# Patient Record
Sex: Male | Born: 1945 | Race: White | Hispanic: No | Smoking: Never smoker
Health system: Southern US, Community
[De-identification: ages and names within clinical notes are randomized; demographics above are authoritative.]

## PROBLEM LIST (undated history)

## (undated) ENCOUNTER — Emergency Department (HOSPITAL_COMMUNITY): Admission: EM | Payer: Self-pay | Source: Home / Self Care

## (undated) DIAGNOSIS — E785 Hyperlipidemia, unspecified: Secondary | ICD-10-CM

## (undated) DIAGNOSIS — I25110 Atherosclerotic heart disease of native coronary artery with unstable angina pectoris: Secondary | ICD-10-CM

## (undated) DIAGNOSIS — I2119 ST elevation (STEMI) myocardial infarction involving other coronary artery of inferior wall: Secondary | ICD-10-CM

## (undated) NOTE — *Deleted (*Deleted)
NAME:  Patrick Weber, MRN:  [id number], DOB:  Nov 16, 1945, LOS: 0 ADMISSION DATE:  Mar 15, 2020, CONSULTATION DATE:  10/19 REFERRING MD:  [place], CHIEF COMPLAINT:  Cardiac arrest/vent management    Brief History   63 year old male w/ known CAD and ICM (last cath w/ PCI to LAD 3/21)  admitted 10/19 s/p STEMI after holding Brilinta for planned hernia surg. Had witness VT/VF arrest on card cath table. Intubated by anesthesia. Underwent left heart cath. PCCM asked to assist w/ care   History of present illness   59 year old male w/ hx as below had Brilinta on hold x 5d for planned elective hernia repair. Presented to ER 10/19 w/ cc: sudden onset chest pain and associated dyspnea. Rushed to cardiac cath lab for STEMI. While on cardiac cath table had witnessed VT and VF arrest X ** total down time estimated at ** w/ provider CPR. Left heart cath showed: **, underwent** and subsequent impella device. PCCM asked to assist w/ post-arrest stabilization, critical care and ventilator management.    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   Significant Hospital Events   ***  Consults:  ***  Procedures:  ***  Significant Diagnostic Tests:  ***  Micro Data:  ***  Antimicrobials:  ***   Interim history/subjective:  ***  Objective   SpO2 100 %.       No intake or output data in the 24 hours ending 03/15/2020 1143 There were no vitals filed for this visit.  Examination: General: *** HENT: *** Lungs: *** Cardiovascular: *** Abdomen: *** Extremities: *** Neuro: *** GU: ***  Resolved Hospital Problem list   ***  Assessment & Plan:  ***  Best practice:  Diet: *** Pain/Anxiety/Delirium protocol (if indicated): *** VAP protocol (if indicated): *** DVT prophylaxis: *** GI prophylaxis: *** Glucose control: *** Mobility: *** Code Status: *** Family Communication: ***  Disposition:   Labs   CBC: No results for input(s): WBC, NEUTROABS, HGB, HCT, MCV, PLT in the last 168 hours.  Basic Metabolic Panel: Recent Labs  Lab 02/20/20 1346  NA 137  K 3.7  CL 105  CO2 25  GLUCOSE 99  BUN 13  CREATININE 0.94  CALCIUM 8.2*   GFR: Estimated Creatinine Clearance: 78 mL/min (by C-G formula based on SCr of 0.94 mg/dL). No results for input(s): PROCALCITON, WBC, LATICACIDVEN in the last 168 hours.  Liver Function Tests: No results for input(s): AST, ALT, ALKPHOS, BILITOT, PROT, ALBUMIN in the last 168 hours. No results for input(s): LIPASE, AMYLASE in the last 168 hours. No results for input(s): AMMONIA in the last 168 hours.  ABG    Component Value Date/Time   TCO2 22 09/18/2016 1632     Coagulation Profile: No results for input(s): INR, PROTIME in the last 168 hours.  Cardiac Enzymes: No results for input(s): CKTOTAL, CKMB, CKMBINDEX, TROPONINI in the last 168 hours.  HbA1C: Hgb A1c MFr Bld  Date/Time Value Ref Range Status  09/18/2016 07:42 PM 4.8 4.8 - 5.6 % Final    Comment:    (NOTE)         Pre-diabetes: 5.7 - 6.4         Diabetes: >6.4         Glycemic control for adults with diabetes: <7.0     CBG: No results for input(s): GLUCAP in the last 168 hours.  Review of Systems:   ***  Past Medical History  He,  has a past medical history of Acute ST elevation myocardial infarction (STEMI) of inferior wall (HCC) (09/18/2016), Coronary artery disease involving native coronary artery of native heart with unstable angina pectoris (HCC) (09/18/2016), and Hyperlipemia.   Surgical History    Past Surgical History:  Procedure Laterality Date  . CORONARY BALLOON ANGIOPLASTY N/A 07/22/2019   Procedure: CORONARY BALLOON ANGIOPLASTY;  Surgeon: [name], MD;  Location: [facility];  Service: Cardiovascular;  Laterality: N/A;  . CORONARY STENT INTERVENTION N/A 07/22/2019   Procedure: CORONARY STENT INTERVENTION;  Surgeon: [name], MD;  Location: [facility];  Service: Cardiovascular;  Laterality: N/A;  . LEFT HEART CATH AND CORONARY ANGIOGRAPHY N/A 09/18/2016   Procedure: Left Heart Cath and Coronary Angiography;  Surgeon: [name], MD;  Location: [facility];  Service: Cardiovascular;  Laterality: N/A;  . LEFT HEART CATH AND CORONARY ANGIOGRAPHY N/A 07/22/2019   Procedure: LEFT HEART CATH AND CORONARY ANGIOGRAPHY;  Surgeon: [name], MD;  Location: [facility];  Service: Cardiovascular;  Laterality: N/A;     Social History   reports that he has never smoked. He has never used smokeless tobacco. He reports previous alcohol use. He reports that he does not use drugs.   Family History   His family history includes Emphysema in his father; Heart disease in his mother; Other in his son.   Allergies Allergies  Allergen Reactions  . Lidocaine Other (See Comments)    Makes him sleepy.     Home Medications  Prior to Admission medications   Medication Sig Start Date End Date Taking? Authorizing Provider  acetaminophen (TYLENOL) 500 MG tablet Take 1 tablet (500 mg total) by mouth every 6 (six) hours as needed. Patient taking differently: Take 500 mg by mouth every 6 (six) hours as needed for moderate pain or headache.  01/13/20   Daryll Drown, NP  aspirin EC 81 MG tablet Take 81 mg by mouth daily. Swallow whole.    [provider]  atorvastatin (LIPITOR) 80 MG tablet Take 1 tablet (80 mg total) by mouth daily. 07/23/19 02/09/20  [place], Alvira Philips, DO  Carboxymethylcellul-Glycerin (LUBRICATING EYE DROPS OP) Place 1 drop into both eyes daily as needed (dry eyes).    [provider]  ezetimibe (ZETIA) 10 MG tablet Take 1 tablet (10 mg total) by mouth daily. 07/23/19 02/09/20  [place], Alvira Philips, DO  furosemide (LASIX) 40 MG tablet Take 1 tablet (40 mg total) by mouth daily. 12/27/19 03/26/20  [name], MD  metoprolol succinate (TOPROL-XL) 50 MG 24 hr tablet  Take 1 tablet (50 mg total) by mouth daily. 10/05/19 02/09/20  Ronney Asters, NP  nitroGLYCERIN (NITROSTAT) 0.4 MG SL tablet Place 1 tablet (0.4 mg total) under the tongue every 5 (five) minutes x 3 doses as needed for chest pain. 07/28/19   Ronney Asters, NP  sacubitril-valsartan (ENTRESTO) 97-103 MG Take 1 tablet by mouth 2 (two) times daily. 10/05/19   Ronney Asters, NP  spironolactone (ALDACTONE) 25 MG tablet Take 12.5 mg by mouth daily.    [provider]  ticagrelor (BRILINTA) 90 MG TABS tablet Take 1 tablet (90 mg total) by mouth 2 (two) times daily. 08/29/19   Ronney Asters, NP     Critical care time: ***

---

## 2016-09-18 ENCOUNTER — Inpatient Hospital Stay (HOSPITAL_COMMUNITY): Payer: Medicare HMO

## 2016-09-18 ENCOUNTER — Encounter (HOSPITAL_COMMUNITY)
Admission: AD | Disposition: A | Discharge status: Home / Self Care | Payer: Self-pay | Source: Home / Self Care | Attending: Internal Medicine

## 2016-09-18 ENCOUNTER — Inpatient Hospital Stay (HOSPITAL_COMMUNITY)
Admission: AD | Admit: 2016-09-18 | Discharge: 2016-09-22 | DRG: 247 | Disposition: A | Discharge status: Home / Self Care | Payer: Medicare HMO | Attending: Internal Medicine | Admitting: Internal Medicine

## 2016-09-18 ENCOUNTER — Encounter (HOSPITAL_COMMUNITY): Payer: Self-pay | Admitting: Cardiology

## 2016-09-18 DIAGNOSIS — I2119 ST elevation (STEMI) myocardial infarction involving other coronary artery of inferior wall: Principal | ICD-10-CM

## 2016-09-18 DIAGNOSIS — E782 Mixed hyperlipidemia: Secondary | ICD-10-CM | POA: Clinically undetermined

## 2016-09-18 DIAGNOSIS — R072 Precordial pain: Secondary | ICD-10-CM | POA: Diagnosis not present

## 2016-09-18 DIAGNOSIS — E785 Hyperlipidemia, unspecified: Secondary | ICD-10-CM | POA: Diagnosis present

## 2016-09-18 DIAGNOSIS — R001 Bradycardia, unspecified: Secondary | ICD-10-CM | POA: Diagnosis not present

## 2016-09-18 DIAGNOSIS — L8990 Pressure ulcer of unspecified site, unspecified stage: Secondary | ICD-10-CM | POA: Insufficient documentation

## 2016-09-18 DIAGNOSIS — I25110 Atherosclerotic heart disease of native coronary artery with unstable angina pectoris: Secondary | ICD-10-CM

## 2016-09-18 DIAGNOSIS — R739 Hyperglycemia, unspecified: Secondary | ICD-10-CM | POA: Diagnosis present

## 2016-09-18 DIAGNOSIS — Z955 Presence of coronary angioplasty implant and graft: Secondary | ICD-10-CM

## 2016-09-18 DIAGNOSIS — I959 Hypotension, unspecified: Secondary | ICD-10-CM | POA: Diagnosis not present

## 2016-09-18 DIAGNOSIS — I712 Thoracic aortic aneurysm, without rupture: Secondary | ICD-10-CM | POA: Diagnosis present

## 2016-09-18 DIAGNOSIS — I2510 Atherosclerotic heart disease of native coronary artery without angina pectoris: Secondary | ICD-10-CM | POA: Diagnosis present

## 2016-09-18 DIAGNOSIS — I472 Ventricular tachycardia: Secondary | ICD-10-CM | POA: Diagnosis not present

## 2016-09-18 DIAGNOSIS — I2111 ST elevation (STEMI) myocardial infarction involving right coronary artery: Secondary | ICD-10-CM | POA: Insufficient documentation

## 2016-09-18 DIAGNOSIS — R079 Chest pain, unspecified: Secondary | ICD-10-CM | POA: Diagnosis present

## 2016-09-18 DIAGNOSIS — I214 Non-ST elevation (NSTEMI) myocardial infarction: Secondary | ICD-10-CM | POA: Diagnosis present

## 2016-09-18 DIAGNOSIS — I213 ST elevation (STEMI) myocardial infarction of unspecified site: Secondary | ICD-10-CM

## 2016-09-18 HISTORY — DX: Atherosclerotic heart disease of native coronary artery with unstable angina pectoris: I25.110

## 2016-09-18 HISTORY — PX: LEFT HEART CATH AND CORONARY ANGIOGRAPHY: CATH118249

## 2016-09-18 HISTORY — DX: Hyperlipidemia, unspecified: E78.5

## 2016-09-18 HISTORY — DX: ST elevation (STEMI) myocardial infarction involving other coronary artery of inferior wall: I21.19

## 2016-09-18 LAB — POCT I-STAT, CHEM 8
BUN: 17 mg/dL (ref 6–20)
CALCIUM ION: 1.16 mmol/L (ref 1.15–1.40)
CHLORIDE: 107 mmol/L (ref 101–111)
Creatinine, Ser: 0.8 mg/dL (ref 0.61–1.24)
GLUCOSE: 119 mg/dL — AB (ref 65–99)
HCT: 40 % (ref 39.0–52.0)
Hemoglobin: 13.6 g/dL (ref 13.0–17.0)
Potassium: 3.5 mmol/L (ref 3.5–5.1)
SODIUM: 140 mmol/L (ref 135–145)
TCO2: 22 mmol/L (ref 0–100)

## 2016-09-18 LAB — COMPREHENSIVE METABOLIC PANEL
ALK PHOS: 55 U/L (ref 38–126)
ALT: 16 U/L — AB (ref 17–63)
AST: 35 U/L (ref 15–41)
Albumin: 3.5 g/dL (ref 3.5–5.0)
Anion gap: 9 (ref 5–15)
BUN: 13 mg/dL (ref 6–20)
CALCIUM: 8.1 mg/dL — AB (ref 8.9–10.3)
CO2: 20 mmol/L — ABNORMAL LOW (ref 22–32)
CREATININE: 0.93 mg/dL (ref 0.61–1.24)
Chloride: 109 mmol/L (ref 101–111)
Glucose, Bld: 135 mg/dL — ABNORMAL HIGH (ref 65–99)
Potassium: 3.8 mmol/L (ref 3.5–5.1)
Sodium: 138 mmol/L (ref 135–145)
TOTAL PROTEIN: 6.3 g/dL — AB (ref 6.5–8.1)
Total Bilirubin: 0.8 mg/dL (ref 0.3–1.2)

## 2016-09-18 LAB — CBC WITH DIFFERENTIAL/PLATELET
Basophils Absolute: 0 10*3/uL (ref 0.0–0.1)
Basophils Relative: 0 %
EOS ABS: 0 10*3/uL (ref 0.0–0.7)
EOS PCT: 0 %
HCT: 40.2 % (ref 39.0–52.0)
Hemoglobin: 14.2 g/dL (ref 13.0–17.0)
LYMPHS ABS: 1.1 10*3/uL (ref 0.7–4.0)
LYMPHS PCT: 12 %
MCH: 30.9 pg (ref 26.0–34.0)
MCHC: 35.3 g/dL (ref 30.0–36.0)
MCV: 87.4 fL (ref 78.0–100.0)
MONO ABS: 0.3 10*3/uL (ref 0.1–1.0)
MONOS PCT: 3 %
Neutro Abs: 8.2 10*3/uL — ABNORMAL HIGH (ref 1.7–7.7)
Neutrophils Relative %: 85 %
PLATELETS: 210 10*3/uL (ref 150–400)
RBC: 4.6 MIL/uL (ref 4.22–5.81)
RDW: 13.3 % (ref 11.5–15.5)
WBC: 9.7 10*3/uL (ref 4.0–10.5)

## 2016-09-18 LAB — PROTIME-INR
INR: 1.24
Prothrombin Time: 15.7 seconds — ABNORMAL HIGH (ref 11.4–15.2)

## 2016-09-18 LAB — TROPONIN I: Troponin I: 1.92 ng/mL (ref <0.03)

## 2016-09-18 LAB — POCT ACTIVATED CLOTTING TIME
ACTIVATED CLOTTING TIME: 202 s
ACTIVATED CLOTTING TIME: 257 s
ACTIVATED CLOTTING TIME: 274 s
Activated Clotting Time: 235 seconds
Activated Clotting Time: 246 seconds

## 2016-09-18 LAB — MRSA PCR SCREENING: MRSA by PCR: NEGATIVE

## 2016-09-18 LAB — T4, FREE: Free T4: 0.98 ng/dL (ref 0.61–1.12)

## 2016-09-18 LAB — TSH: TSH: 0.978 u[IU]/mL (ref 0.350–4.500)

## 2016-09-18 LAB — MAGNESIUM: MAGNESIUM: 2 mg/dL (ref 1.7–2.4)

## 2016-09-18 SURGERY — LEFT HEART CATH AND CORONARY ANGIOGRAPHY
Anesthesia: LOCAL

## 2016-09-18 MED ORDER — SODIUM CHLORIDE 0.9 % IV SOLN
INTRAVENOUS | Status: AC | PRN
  Administered 2016-09-18: 100 mL/h via INTRAVENOUS

## 2016-09-18 MED ORDER — VERAPAMIL HCL 2.5 MG/ML IV SOLN
INTRAVENOUS | Status: AC
  Filled 2016-09-18: qty 2

## 2016-09-18 MED ORDER — ATROPINE SULFATE 1 MG/10ML IJ SOSY
PREFILLED_SYRINGE | INTRAMUSCULAR | Status: DC | PRN
  Administered 2016-09-18: 0.5 mg via INTRAVENOUS

## 2016-09-18 MED ORDER — TICAGRELOR 90 MG PO TABS
ORAL_TABLET | ORAL | Status: AC
  Filled 2016-09-18: qty 1

## 2016-09-18 MED ORDER — HEPARIN SODIUM (PORCINE) 1000 UNIT/ML IJ SOLN
INTRAMUSCULAR | Status: DC | PRN
  Administered 2016-09-18: 8000 [IU] via INTRAVENOUS
  Administered 2016-09-18: 2000 [IU] via INTRAVENOUS
  Administered 2016-09-18: 3000 [IU] via INTRAVENOUS
  Administered 2016-09-18: 2000 [IU] via INTRAVENOUS
  Administered 2016-09-18 (×2): 3000 [IU] via INTRAVENOUS

## 2016-09-18 MED ORDER — TICAGRELOR 90 MG PO TABS
90.0000 mg | ORAL_TABLET | Freq: Two times a day (BID) | ORAL | Status: DC
  Administered 2016-09-19 – 2016-09-22 (×7): 90 mg via ORAL
  Filled 2016-09-18 (×7): qty 1

## 2016-09-18 MED ORDER — HEPARIN SODIUM (PORCINE) 1000 UNIT/ML IJ SOLN
INTRAMUSCULAR | Status: AC
  Filled 2016-09-18: qty 1

## 2016-09-18 MED ORDER — TIROFIBAN HCL IN NACL 5-0.9 MG/100ML-% IV SOLN
INTRAVENOUS | Status: AC
  Filled 2016-09-18: qty 100

## 2016-09-18 MED ORDER — ATROPINE SULFATE 1 MG/10ML IJ SOSY
PREFILLED_SYRINGE | INTRAMUSCULAR | Status: AC
  Filled 2016-09-18: qty 10

## 2016-09-18 MED ORDER — DOPAMINE-DEXTROSE 3.2-5 MG/ML-% IV SOLN
INTRAVENOUS | Status: AC
  Filled 2016-09-18: qty 250

## 2016-09-18 MED ORDER — VERAPAMIL HCL 2.5 MG/ML IV SOLN
INTRAVENOUS | Status: DC | PRN
  Administered 2016-09-18: 10 mL via INTRA_ARTERIAL

## 2016-09-18 MED ORDER — TIROFIBAN HCL IN NACL 5-0.9 MG/100ML-% IV SOLN
0.1500 ug/kg/min | INTRAVENOUS | Status: AC
  Administered 2016-09-18 – 2016-09-19 (×4): 0.15 ug/kg/min via INTRAVENOUS
  Filled 2016-09-18 (×4): qty 100

## 2016-09-18 MED ORDER — SODIUM CHLORIDE 0.9% FLUSH: 3.0000 mL | INTRAVENOUS | Status: DC | PRN

## 2016-09-18 MED ORDER — LIDOCAINE HCL (PF) 1 % IJ SOLN
INTRAMUSCULAR | Status: AC
  Filled 2016-09-18: qty 30

## 2016-09-18 MED ORDER — NOREPINEPHRINE BITARTRATE 1 MG/ML IV SOLN
INTRAVENOUS | Status: DC | PRN
  Administered 2016-09-18: 5 ug/min via INTRAVENOUS

## 2016-09-18 MED ORDER — TICAGRELOR 90 MG PO TABS
ORAL_TABLET | ORAL | Status: DC | PRN
  Administered 2016-09-18: 180 mg via ORAL

## 2016-09-18 MED ORDER — SODIUM CHLORIDE 0.9 % IV SOLN
INTRAVENOUS | Status: AC | PRN
  Administered 2016-09-18: 555 mL via INTRAVENOUS

## 2016-09-18 MED ORDER — TIROFIBAN HCL IV 12.5 MG/250 ML
INTRAVENOUS | Status: AC | PRN
  Administered 2016-09-18: .15 ug/kg/min via INTRAVENOUS

## 2016-09-18 MED ORDER — FENTANYL CITRATE (PF) 100 MCG/2ML IJ SOLN
INTRAMUSCULAR | Status: DC | PRN
  Administered 2016-09-18: 25 ug via INTRAVENOUS

## 2016-09-18 MED ORDER — IOPAMIDOL (ISOVUE-370) INJECTION 76%
INTRAVENOUS | Status: AC
  Filled 2016-09-18: qty 125

## 2016-09-18 MED ORDER — NITROGLYCERIN 0.4 MG SL SUBL: 0.4000 mg | SUBLINGUAL_TABLET | SUBLINGUAL | Status: DC | PRN

## 2016-09-18 MED ORDER — SODIUM CHLORIDE 0.9 % IV SOLN: INTRAVENOUS | Status: AC

## 2016-09-18 MED ORDER — TIROFIBAN (AGGRASTAT) BOLUS VIA INFUSION
INTRAVENOUS | Status: DC | PRN
  Administered 2016-09-18: 2155 ug via INTRAVENOUS

## 2016-09-18 MED ORDER — SODIUM CHLORIDE 0.9 % IV SOLN: INTRAVENOUS | Status: DC

## 2016-09-18 MED ORDER — ENOXAPARIN SODIUM 40 MG/0.4ML ~~LOC~~ SOLN
40.0000 mg | SUBCUTANEOUS | Status: DC
  Administered 2016-09-19 – 2016-09-21 (×3): 40 mg via SUBCUTANEOUS
  Filled 2016-09-18 (×3): qty 0.4

## 2016-09-18 MED ORDER — ONDANSETRON HCL 4 MG/2ML IJ SOLN
INTRAMUSCULAR | Status: AC
  Filled 2016-09-18: qty 2

## 2016-09-18 MED ORDER — IOPAMIDOL (ISOVUE-370) INJECTION 76%
INTRAVENOUS | Status: AC
Start: 2016-09-18 — End: 2016-09-18
  Filled 2016-09-18: qty 100

## 2016-09-18 MED ORDER — ACETAMINOPHEN 325 MG PO TABS
650.0000 mg | ORAL_TABLET | ORAL | Status: DC | PRN
  Administered 2016-09-19: 650 mg via ORAL
  Filled 2016-09-18: qty 2

## 2016-09-18 MED ORDER — ALPRAZOLAM 0.25 MG PO TABS: 0.2500 mg | ORAL_TABLET | Freq: Two times a day (BID) | ORAL | Status: DC | PRN

## 2016-09-18 MED ORDER — NITROGLYCERIN 1 MG/10 ML FOR IR/CATH LAB
INTRA_ARTERIAL | Status: AC
  Filled 2016-09-18: qty 10

## 2016-09-18 MED ORDER — ONDANSETRON HCL 4 MG/2ML IJ SOLN
4.0000 mg | Freq: Four times a day (QID) | INTRAMUSCULAR | Status: DC | PRN
  Administered 2016-09-19: 4 mg via INTRAVENOUS
  Filled 2016-09-18: qty 2

## 2016-09-18 MED ORDER — SODIUM CHLORIDE 0.9 % IV SOLN: 250.0000 mL | INTRAVENOUS | Status: DC | PRN

## 2016-09-18 MED ORDER — METOPROLOL TARTRATE 12.5 MG HALF TABLET
12.5000 mg | ORAL_TABLET | Freq: Two times a day (BID) | ORAL | Status: DC
  Administered 2016-09-19 – 2016-09-20 (×3): 12.5 mg via ORAL
  Filled 2016-09-18 (×3): qty 1

## 2016-09-18 MED ORDER — DOPAMINE-DEXTROSE 3.2-5 MG/ML-% IV SOLN
INTRAVENOUS | Status: DC | PRN
  Administered 2016-09-18: 10 ug/kg/min via INTRAVENOUS

## 2016-09-18 MED ORDER — LIDOCAINE HCL (PF) 1 % IJ SOLN
INTRAMUSCULAR | Status: DC | PRN
Start: 2016-09-18 — End: 2016-09-18
  Administered 2016-09-18: 2 mL

## 2016-09-18 MED ORDER — ASPIRIN EC 81 MG PO TBEC
81.0000 mg | DELAYED_RELEASE_TABLET | Freq: Every day | ORAL | Status: DC
  Administered 2016-09-19 – 2016-09-22 (×5): 81 mg via ORAL
  Filled 2016-09-18 (×5): qty 1

## 2016-09-18 MED ORDER — ZOLPIDEM TARTRATE 5 MG PO TABS: 5.0000 mg | ORAL_TABLET | Freq: Every evening | ORAL | Status: DC | PRN

## 2016-09-18 MED ORDER — IOPAMIDOL (ISOVUE-370) INJECTION 76%
INTRAVENOUS | Status: AC
  Filled 2016-09-18: qty 100

## 2016-09-18 MED ORDER — ATORVASTATIN CALCIUM 80 MG PO TABS
80.0000 mg | ORAL_TABLET | Freq: Every day | ORAL | Status: DC
  Administered 2016-09-19 – 2016-09-21 (×3): 80 mg via ORAL
  Filled 2016-09-18 (×3): qty 1

## 2016-09-18 MED ORDER — HYDRALAZINE HCL 20 MG/ML IJ SOLN: 5.0000 mg | INTRAMUSCULAR | Status: AC | PRN

## 2016-09-18 MED ORDER — HEPARIN (PORCINE) IN NACL 2-0.9 UNIT/ML-% IJ SOLN
INTRAMUSCULAR | Status: AC
  Filled 2016-09-18: qty 1000

## 2016-09-18 MED ORDER — ONDANSETRON HCL 4 MG/2ML IJ SOLN
INTRAMUSCULAR | Status: DC | PRN
  Administered 2016-09-18 (×2): 4 mg via INTRAVENOUS

## 2016-09-18 MED ORDER — FENTANYL CITRATE (PF) 100 MCG/2ML IJ SOLN
INTRAMUSCULAR | Status: AC
  Filled 2016-09-18: qty 2

## 2016-09-18 MED ORDER — SODIUM CHLORIDE 0.9% FLUSH
3.0000 mL | Freq: Two times a day (BID) | INTRAVENOUS | Status: DC
  Administered 2016-09-18 – 2016-09-21 (×6): 3 mL via INTRAVENOUS

## 2016-09-18 SURGICAL SUPPLY — 26 items
BALLN EMERGE MR 3.0X12 (BALLOONS) ×2
BALLN MOZEC 2.0X12 (BALLOONS) ×2
BALLN ~~LOC~~ EUPHORA RX 4.5X15 (BALLOONS) ×2
BALLOON EMERGE MR 3.0X12 (BALLOONS) IMPLANT
BALLOON MOZEC 2.0X12 (BALLOONS) IMPLANT
BALLOON ~~LOC~~ EUPHORA RX 4.5X15 (BALLOONS) IMPLANT
CATH EXTRAC PRONTO 5.5F 138CM (CATHETERS) ×1 IMPLANT
CATH INFINITI 5 FR JL3.5 (CATHETERS) ×1 IMPLANT
CATH INFINITI 5FR ANG PIGTAIL (CATHETERS) ×1 IMPLANT
CATH LAUNCHER 6FR AL.75 (CATHETERS) ×1 IMPLANT
CATH VISTA GUIDE 6FR JR4 (CATHETERS) ×1 IMPLANT
DEVICE RAD COMP TR BAND LRG (VASCULAR PRODUCTS) ×1 IMPLANT
ELECT DEFIB PAD ADLT CADENCE (PAD) ×1 IMPLANT
GLIDESHEATH SLEND SS 6F .021 (SHEATH) ×1 IMPLANT
GUIDEWIRE INQWIRE 1.5J.035X260 (WIRE) IMPLANT
INQWIRE 1.5J .035X260CM (WIRE) ×2
KIT ENCORE 26 ADVANTAGE (KITS) ×1 IMPLANT
KIT HEART LEFT (KITS) ×2 IMPLANT
PACK CARDIAC CATHETERIZATION (CUSTOM PROCEDURE TRAY) ×2 IMPLANT
STENT RESOLUTE ONYX 4.0X22 (Permanent Stent) ×1 IMPLANT
STENT RESOLUTE ONYX 4.0X26 (Permanent Stent) ×1 IMPLANT
SYR MEDRAD MARK V 150ML (SYRINGE) ×2 IMPLANT
TRANSDUCER W/STOPCOCK (MISCELLANEOUS) ×2 IMPLANT
TUBING CIL FLEX 10 FLL-RA (TUBING) ×2 IMPLANT
WIRE HI TORQ BMW 190CM (WIRE) ×2 IMPLANT
WIRE RUNTHROUGH .014X180CM (WIRE) ×1 IMPLANT

## 2016-09-18 NOTE — Progress Notes (Deleted)
Patient had decreased HR as low as 47, Arterial BP dropped into the low 70s. Turned off Nitro and notified MD. Orders received to keep nitro off, give 250cc bolus, and hold antihypertensive meds.  

## 2016-09-18 NOTE — H&P (Signed)
         Loma SenderSteven W Willmott is an 71 y.o. male.    Primary Cardiologist:new Dr. Okey DupreEnd  No primary care provider on file.  Chief Complaint: chest pain began 2 hours ago HPI:  [age] YOM with no prior cardiac hx or hx of CVA, HTN or diabetes,  Today came home from work and developed chest pain mid sternal.  He called EMS and has ST elevation in II, III, AVF.  He has had ASA and NTG X 2 pain continues but he did not wish to have Morphine.   Dr. Okey DupreEnd has seen and examined him in the ER and has brought him emergently to Cath lab.      History reviewed. No pertinent past medical history. no past Medical hx.   History reviewed. No pertinent surgical history.  No past surgical hx.    Family History  Problem Relation Age of Onset  . Heart disease Mother   . Emphysema Father    Social History:  reports that he has never smoked. He has never used smokeless tobacco. He reports that he does not use drugs. His alcohol history is not on file.  Allergies:  Allergies  Allergen Reactions  . Lidocaine Other (See Comments)    Makes him sleepy.   OUTPATIENT MEDICATIONS: No prescriptions prior to admission.  NO Medications at home   No results found for this or any previous visit (from the past 48 hour(s)). No results found.  ROS: General:no colds or fevers, no weight changes Skin:no rashes or ulcers HEENT:no blurred vision, no congestion CV:see HPI PUL:see HPI GI:no diarrhea constipation or melena, no indigestion GU:no hematuria, no dysuria MS:no joint pain, no claudication Neuro:no syncope, no lightheadedness Endo:no diabetes, no thyroid disease   Height 6\' 1"  (1.854 m), weight 190 lb (86.2 kg), SpO2 97 %. BP 94/59 P 88 respirations 18  PE: General:Pleasant affect, NAD Skin:Warm and dry, brisk capillary refill HEENT:normocephalic, sclera clear, mucus membranes moist Neck:supple, no JVD, no bruits  Heart:S1S2 RRR without murmur, gallup, rub or click Lungs:clear, ant  without rales, rhonchi,  or wheezes ZOX:WRUEAbd:soft, non tender, + BS, do not palpate liver spleen or masses Ext:no lower ext edema, 2+ pedal pulses, 2+ radial pulses Neuro:alert and oriented X 3, MAE, follows commands, + facial symmetry     Assessment/Plan Principal Problem:   Acute ST elevation myocardial infarction (STEMI) of inferior wall (HCC) - to cath lab emergently, will check serial troponins, ekgs, lipids and add statin.  Check CXR.  Other plan post procedure.    Nada BoozerLaura Ingold Nurse Practitioner Certified [facility] Pager [phone] or after 5pm or weekends call [phone] 09/18/2016, 4:55 PM

## 2016-09-18 NOTE — Progress Notes (Signed)
ANTICOAGULATION CONSULT NOTE - Initial Consult  Pharmacy Consult for Tirofiban Indication: STEMI  Allergies  Allergen Reactions  . Lidocaine Other (See Comments)    Makes him sleepy.   Patient Measurements: Height: 6\' 1"  (185.4 cm) Weight: 190 lb (86.2 kg) IBW/kg (Calculated) : 79.9  Vital Signs: BP: 124/78 (05/17 1758) Pulse Rate: 210 (05/17 1823)  Assessment: 71 yo M presents on 5/17 with CP. Found to have STEMI and taken to cath emergently. Tirofiban started in the cath and pharmacy consulted to continue for 24 hrs. Bolused in the cath lab. Hgb stable.  Goal of Therapy:  Monitor platelets by anticoagulation protocol: Yes   Plan:  Continue tirofiban at 0.5415mcg/kg/min for 24 hrs Monitor platelets, Hgb, s/s of bleeding  Morgen Ritacco J 09/18/2016,6:37 PM

## 2016-09-19 ENCOUNTER — Inpatient Hospital Stay (HOSPITAL_COMMUNITY): Payer: Medicare HMO

## 2016-09-19 ENCOUNTER — Encounter (HOSPITAL_COMMUNITY): Payer: Self-pay | Admitting: Internal Medicine

## 2016-09-19 DIAGNOSIS — I25110 Atherosclerotic heart disease of native coronary artery with unstable angina pectoris: Secondary | ICD-10-CM

## 2016-09-19 DIAGNOSIS — E785 Hyperlipidemia, unspecified: Secondary | ICD-10-CM | POA: Clinically undetermined

## 2016-09-19 DIAGNOSIS — E782 Mixed hyperlipidemia: Secondary | ICD-10-CM | POA: Clinically undetermined

## 2016-09-19 DIAGNOSIS — R739 Hyperglycemia, unspecified: Secondary | ICD-10-CM | POA: Diagnosis present

## 2016-09-19 DIAGNOSIS — R072 Precordial pain: Secondary | ICD-10-CM

## 2016-09-19 LAB — CBC
HEMATOCRIT: 39 % (ref 39.0–52.0)
HEMOGLOBIN: 13.3 g/dL (ref 13.0–17.0)
MCH: 30.4 pg (ref 26.0–34.0)
MCHC: 34.1 g/dL (ref 30.0–36.0)
MCV: 89.2 fL (ref 78.0–100.0)
Platelets: 177 10*3/uL (ref 150–400)
RBC: 4.37 MIL/uL (ref 4.22–5.81)
RDW: 13.5 % (ref 11.5–15.5)
WBC: 8.5 10*3/uL (ref 4.0–10.5)

## 2016-09-19 LAB — BASIC METABOLIC PANEL
ANION GAP: 8 (ref 5–15)
BUN: 11 mg/dL (ref 6–20)
CALCIUM: 8.2 mg/dL — AB (ref 8.9–10.3)
CO2: 20 mmol/L — ABNORMAL LOW (ref 22–32)
CREATININE: 0.88 mg/dL (ref 0.61–1.24)
Chloride: 110 mmol/L (ref 101–111)
Glucose, Bld: 116 mg/dL — ABNORMAL HIGH (ref 65–99)
Potassium: 3.6 mmol/L (ref 3.5–5.1)
Sodium: 138 mmol/L (ref 135–145)

## 2016-09-19 LAB — LIPID PANEL
Cholesterol: 159 mg/dL (ref 0–200)
HDL: 36 mg/dL — ABNORMAL LOW (ref >40)
LDL Cholesterol: 109 mg/dL — ABNORMAL HIGH (ref 0–99)
TRIGLYCERIDES: 69 mg/dL (ref <150)
Total CHOL/HDL Ratio: 4.4 RATIO
VLDL: 14 mg/dL (ref 0–40)

## 2016-09-19 LAB — ECHOCARDIOGRAM COMPLETE
Height: 73 in
Weight: 3030 oz

## 2016-09-19 LAB — TROPONIN I
Troponin I: 5.11 ng/mL (ref <0.03)
Troponin I: 9.61 ng/mL (ref <0.03)

## 2016-09-19 LAB — HEMOGLOBIN A1C
HEMOGLOBIN A1C: 4.8 % (ref 4.8–5.6)
MEAN PLASMA GLUCOSE: 91 mg/dL

## 2016-09-19 MED FILL — Heparin Sodium (Porcine) 2 Unit/ML in Sodium Chloride 0.9%: INTRAMUSCULAR | Qty: 500 | Status: AC

## 2016-09-19 MED FILL — Nitroglycerin IV Soln 100 MCG/ML in D5W: INTRA_ARTERIAL | Qty: 10 | Status: AC

## 2016-09-19 NOTE — Care Management Note (Signed)
Case Management Note Marvetta Gibbons RN, BSN Unit 2W-Case Manager-- [place] coverage [phone]   Patient Details  Name: KUSH FARABEE MRN: [id number] Date of Birth: 05-21-1945  Subjective/Objective:    Pt admitted with STEMI-  Emergent Cath with 2 drug eluting stents placed- noted pt started on Brilinta              Action/Plan: PTA pt lived at home- insurance check submitted for Brilinta coverage- per benefits check -copay cost- $242 due to pt not meeting deductible yet- spoke with pt and family at bedside- 30 day free card provided to pt to use on discharge- coverage info shared- once deductible met copay will fall under Tier 3 drug cost- per pt [facility] [place] to [facility] for any further needs  Expected Discharge Date:                  Expected Discharge Plan:  Home/Self Care  In-House Referral:     Discharge planning Services  CM Consult, Medication Assistance  Post Acute Care Choice:    Choice offered to:     DME Arranged:    DME Agency:     HH Arranged:    HH Agency:     Status of Service:  In process, will continue to follow  If discussed at Long Length of Stay Meetings, dates discussed:    Additional Comments:  Dawayne Patricia, RN 09/19/2016, 10:28 AM

## 2016-09-19 NOTE — Progress Notes (Signed)
Progress Note  Patient Name: Patrick Weber Date of Encounter: 09/19/2016  Primary Cardiologist: End  Patient Profile     71 y.o. male With no prior past history of presented to his PCP with chest pain and nausea lasting 2 hours. Noted to have inferior ST elevation and was taken directly to [facility] for [facility] cardiac catheterization. Found to have thrombotic occlusion of the distal RCA with large ectatic diffuse disease in the RCA. There was distal embolization in both RPDA and RPL. Following PCI, chest pain resolved as did nausea. Plan was to continue Aggrastat 24 hours and consider relook catheterization in 2-3 days.  Subjective   Feeling well. No further nausea or chest pain. Breathing well. Wrist site stable.  Inpatient Medications    Scheduled Meds: . aspirin EC  81 mg Oral Daily  . atorvastatin  80 mg Oral q1800  . enoxaparin (LOVENOX) injection  40 mg Subcutaneous Q24H  . metoprolol tartrate  12.5 mg Oral BID  . sodium chloride flush  3 mL Intravenous Q12H  . ticagrelor  90 mg Oral Q12H   Continuous Infusions: . sodium chloride    . sodium chloride    . tirofiban 0.15 mcg/kg/min (09/19/16 0900)   PRN Meds: sodium chloride, acetaminophen, ALPRAZolam, nitroGLYCERIN, ondansetron (ZOFRAN) IV, sodium chloride flush, zolpidem   Vital Signs    Vitals:   09/19/16 0400 09/19/16 0500 09/19/16 0600 09/19/16 0834  BP:    107/81  Pulse: (!) 59 (!) 58 66 80  Resp: 14 11 16 18   Temp: 97.8 F (36.6 C)   97.4 F (36.3 C)  TempSrc: Oral   Oral  SpO2: 98% 97% 95% 98%  Weight:  189 lb 6 oz (85.9 kg)    Height:        Intake/Output Summary (Last 24 hours) at 09/19/16 1019 Last data filed at 09/19/16 0900  Gross per 24 hour  Intake           473.76 ml  Output              850 ml  Net          -376.24 ml   Filed Weights   09/18/16 1619 09/18/16 1823 09/19/16 0500  Weight: 190 lb (86.2 kg) 187 lb 2.7 oz (84.9 kg) 189 lb 6 oz (85.9 kg)    Telemetry    NSR 60s-70s.  Case no PVCs with short burst of NSVT - Personally Reviewed  ECG     Sinus rhythm with PVCs occasional fusion complex is. Rate 69 BPM. Inferior Q waves suggestive inferior MI, age undetermined however with T-wave inversions in III with suggest recent- Personally Reviewed  Physical Exam   GEN: No acute distress.   Neck: No JVD, Or bruit Cardiac: RRR, no murmurs, rubs, or gallops.  Respiratory: Clear to auscultation bilaterally. Nonlabored GI: Soft, nontender, non-distended; NABS MS: No edema; No deformity.  Radial cath site C/D/I. No hematoma. Neuro:  Nonfocal Psych: Normal affect   Labs    Chemistry Recent Labs Lab 09/18/16 1632 09/18/16 1942 09/19/16 0627  NA 140 138 138  K 3.5 3.8 3.6  CL 107 109 110  CO2  --  20* 20*  GLUCOSE 119* 135* 116*  BUN 17 13 11   CREATININE 0.80 0.93 0.88  CALCIUM  --  8.1* 8.2*  PROT  --  6.3*  --   ALBUMIN  --  3.5  --   AST  --  35  --   ALT  --  16*  --   ALKPHOS  --  55  --   BILITOT  --  0.8  --   GFRNONAA  --  >60 >60  GFRAA  --  >60 >60  ANIONGAP  --  9 8     Hematology Recent Labs Lab 09/18/16 1632 09/18/16 1942 09/19/16 0627  WBC  --  9.7 8.5  RBC  --  4.60 4.37  HGB 13.6 14.2 13.3  HCT 40.0 40.2 39.0  MCV  --  87.4 89.2  MCH  --  30.9 30.4  MCHC  --  35.3 34.1  RDW  --  13.3 13.5  PLT  --  210 177    Cardiac Enzymes Recent Labs Lab 09/18/16 1942 09/19/16 0038 09/19/16 0627  TROPONINI 1.92* 5.11* 9.61*   No results for input(s): TROPIPOC in the last 168 hours.   BNPNo results for input(s): BNP, PROBNP in the last 168 hours.   DDimer No results for input(s): DDIMER in the last 168 hours.   Lab Results  Component Value Date   CHOL 159 09/19/2016   HDL 36 (L) 09/19/2016   LDLCALC 109 (H) 09/19/2016   TRIG 69 09/19/2016   CHOLHDL 4.4 09/19/2016   Lab Results  Component Value Date   HGBA1C 4.8 09/18/2016    Radiology    Portable Chest X-ray 1 View  Result Date: 09/18/2016 CLINICAL DATA:  ST  elevation myocardial infarction. EXAM: PORTABLE CHEST 1 VIEW COMPARISON:  None. FINDINGS: The heart size and mediastinal contours are within normal limits. Both lungs are clear. The visualized skeletal structures are unremarkable. IMPRESSION: No active disease. Electronically Signed   By: Myles RosenthalJohn  Stahl M.D.   On: 09/18/2016 20:53    Cardiac Studies    Left Heart Cath and Coronary Angiography - 09/18/16  Films personally reviewed Conclusions: 1. Acute inferior STEMI with thrombotic occlusion of the distal RCA. RCA is a large, ectatic, diffusely diseased vessel. Moderate disease involving the LAD and diagonal branches is also evident. 2. Mildly elevated left ventricular filling pressure. 3. Successful PCI with aspiration thrombectomy and drug-eluting stent placement to the mid and distal RCA. Embolization of thrombus occurred with occlusion of the distal most portions of the PDA and PL branches. Distal RCA: Resolute onyx DES 4.0 mm x 26 mm; mid RCA 4.0 mm 22 mm - postdilated to 4.5 mm  Recommendations: 1. Admit to cardiac ICU for close monitoring. 2. Dual antiplatelet therapy with aspirin and ticagrelor for at least 12 months. 3. Continue ticagrelor infusion for 24 hours. 4. Transthoracic echocardiogram to evaluate LV function. 5. Consider relook catheterization 48-72 hours to reassess thrombus burden and stent apposition, given heavy thrombus burden at the time of intervention. 6. Aggressive secondary prevention.   Diagnostic Diagram       Post-Intervention Diagram  Distal RCA: Resolute onyx DES 4.0 mm x 26 mm; mid RCA 4.0 mm 22 mm - postdilated to 4.5 mm           Echo Pending  Assessment & Plan    Principal Problem:   Acute ST elevation myocardial infarction (STEMI) of inferior wall (HCC) Active Problems:   Coronary artery disease involving native coronary artery of native heart with unstable angina pectoris (HCC)   Dyslipidemia, goal LDL below 70   Hyperglycemia  Principal  Problem:   Acute ST elevation myocardial infarction (STEMI) of inferior wall (HCC) /   Coronary artery disease involving native coronary artery of native heart with unstable angina pectoris (HCC) -- Status post  PCI with 2 DES to the RCA - extensive thrombus noted with distal lesion into both PL branch and PDA.  As per original plan. 24 hours of Aggrastat. - We'll follow his symptoms and determine whether or not it would be appropriate to consider relook cath on Monday. Patient is that if he is doing well with no active symptoms, we could probably forego a relook. However if there are any arrhythmias or symptoms over the weekend, we can consider relook on Monday.  Aspirin plus Brilinta for minimum of 1 year. Would be okay to stop aspirin at 3-6 months, but would not stop Brilinta.  Low-dose beta blocker (does not have hypertension) - mostly for stabilization of rhythm  High-dose statin    Dyslipidemia, goal LDL below 70 - high-dose statin   Hyperglycemia -- A1c is 4.8. Therefore this probably stress related. Continue to monitor.  Cardiac rehabilitation  Dispo: For now plan will be to continue in ICU setting while on Aggrastat. This is complete, he could potentially transfer to the floor if stable. Ambulate with cardiac rehabilitation. I will review films with interventional colleagues to determine other thoughts about potentially doing relook.   Signed, Bryan Lemma, MD  09/19/2016, 10:19 AM

## 2016-09-19 NOTE — Progress Notes (Signed)
Per insurance check on Brilinta # 3.  S/W JULIET @  [facility] # [phone]   1. BRILINTA 90 MG BID  COVER- YES  CO-PAY- $ 242.00   DEDUCTIBLE NOT MET  TIER- 3 DRUG  PRIOR APPROVAL- NO   TICAGRELOR- NONE FORMULARY   Alternate   1. CLOPIDOGREL 75 MG BID  COVER- YES  CO-PAY- $ 2.00  TIER- 1 DRUG  PRIOR APPROVAL- NO  MAIL-ORDER FOR 90 DAY SUPPLY ZERO DOLLARS    2. PRASUGREL 10 MG BID  COVER- YES  CO-PAY- $ 181.00  TIER- 3 DRUG  PRIOR APPROVAL- NO    PREFERRED PHARMACY : [facility] AND [facility]. ( [facility] )

## 2016-09-19 NOTE — Progress Notes (Signed)
*  PRELIMINARY RESULTS* Echocardiogram 2D Echocardiogram has been performed.  Pieter PartridgeBrooke S Khalib Fendley 09/19/2016, 10:38 AM

## 2016-09-19 NOTE — Progress Notes (Signed)
CRITICAL VALUE ALERT  Critical value received:0738  Date of notification:09/19/2016  Time of notification:0738  Critical value read back: Yes  Nurse who received alert: Hoover BrownsKristy Joselynne Killam  MD notified (1st page): [place]  Time of first WUJW:1191page:0748   MD notified (2nd page):     Time of second page:  Responding MD: Diona FantiKilroy  Time MD responded: [phone]

## 2016-09-19 NOTE — Progress Notes (Signed)
CARDIAC REHAB PHASE I   PRE:  Rate/Rhythm: 96 SR  BP:  Sitting: 104/77        SaO2: 97 RA  MODE:  Ambulation: 350 ft   POST:  Rate/Rhythm: 95 RA  BP:  Sitting: 109/74         SaO2: 98 RA  Pt ambulated 350 ft on RA, IV, handheld assist, steady gait, tolerated well with no complaints. Began MI/stent education.  Reviewed risk factors, MI book, anti-platelet therapy, stent card, activity restrictions and ntg. Left heart healthy diet handout for pt to review. Pt verbalized understanding. Pt to see case manager regarding brilinta co-pay prior to d/c. Pt to recliner after walk, feet elevated, call bell within reach. Will follow up tomorrow to complete education (needs exercise, phase 2, diet).   [phone] Joylene GrapesEmily C Kameka Whan, RN, BSN 09/19/2016 12:29 PM

## 2016-09-20 DIAGNOSIS — I712 Thoracic aortic aneurysm, without rupture: Secondary | ICD-10-CM

## 2016-09-20 MED ORDER — METOPROLOL TARTRATE 25 MG PO TABS
25.0000 mg | ORAL_TABLET | Freq: Two times a day (BID) | ORAL | Status: DC
  Administered 2016-09-20 – 2016-09-22 (×4): 25 mg via ORAL
  Filled 2016-09-20 (×4): qty 1

## 2016-09-20 NOTE — Progress Notes (Signed)
Progress Note  Patient Name: Patrick SenderSteven W Dolinger Date of Encounter: 09/20/2016  Primary Cardiologist: Dr End  Patient Profile     71 y.o. male presenting with acute inferior MI. Cardiac cath showed thrombotic occlusion of the distal RCA with diffuse disease in the RCA. Pt had aspiration thrombectomy and DES to mid and distal RCA; there was distal embolization in both RPDA and RPL; consider relook catheterization in 2-3 days.  Subjective   No chest pain or dyspnea  Inpatient Medications    Scheduled Meds: . aspirin EC  81 mg Oral Daily  . atorvastatin  80 mg Oral q1800  . enoxaparin (LOVENOX) injection  40 mg Subcutaneous Q24H  . metoprolol tartrate  12.5 mg Oral BID  . sodium chloride flush  3 mL Intravenous Q12H  . ticagrelor  90 mg Oral Q12H   Continuous Infusions: . sodium chloride     PRN Meds: sodium chloride, acetaminophen, ALPRAZolam, nitroGLYCERIN, ondansetron (ZOFRAN) IV, sodium chloride flush, zolpidem   Vital Signs    Vitals:   09/20/16 0500 09/20/16 0600 09/20/16 0700 09/20/16 0939  BP: 101/76 103/79 110/80   Pulse: 65 61 71   Resp: 11 11 15    Temp:    97.7 F (36.5 C)  TempSrc:    Oral  SpO2: 98% 98% 97%   Weight:   85.7 kg (189 lb)   Height:        Intake/Output Summary (Last 24 hours) at 09/20/16 1010 Last data filed at 09/20/16 0800  Gross per 24 hour  Intake            327.5 ml  Output             1300 ml  Net           -972.5 ml   Filed Weights   09/18/16 1823 09/19/16 0500 09/20/16 0700  Weight: 84.9 kg (187 lb 2.7 oz) 85.9 kg (189 lb 6 oz) 85.7 kg (189 lb)    Telemetry    NSR 60s-70s; short NSVT- Personally Reviewed   Physical Exam   GEN: WD/WN Neck: supple Cardiac: RRR Respiratory: CTA; no wheeze GI: NT/ND; no masses MS: No edema; radial cath site with no hematoma Neuro: grossly intact   Labs    Chemistry  Recent Labs Lab 09/18/16 1632 09/18/16 1942 09/19/16 0627  NA 140 138 138  K 3.5 3.8 3.6  CL 107 109 110  CO2   --  20* 20*  GLUCOSE 119* 135* 116*  BUN 17 13 11   CREATININE 0.80 0.93 0.88  CALCIUM  --  8.1* 8.2*  PROT  --  6.3*  --   ALBUMIN  --  3.5  --   AST  --  35  --   ALT  --  16*  --   ALKPHOS  --  55  --   BILITOT  --  0.8  --   GFRNONAA  --  >60 >60  GFRAA  --  >60 >60  ANIONGAP  --  9 8     Hematology  Recent Labs Lab 09/18/16 1632 09/18/16 1942 09/19/16 0627  WBC  --  9.7 8.5  RBC  --  4.60 4.37  HGB 13.6 14.2 13.3  HCT 40.0 40.2 39.0  MCV  --  87.4 89.2  MCH  --  30.9 30.4  MCHC  --  35.3 34.1  RDW  --  13.3 13.5  PLT  --  210 177    Cardiac Enzymes  Recent Labs Lab 09/18/16 1942 09/19/16 0038 09/19/16 0627  TROPONINI 1.92* 5.11* 9.61*    Lab Results  Component Value Date   CHOL 159 09/19/2016   HDL 36 (L) 09/19/2016   LDLCALC 109 (H) 09/19/2016   TRIG 69 09/19/2016   CHOLHDL 4.4 09/19/2016   Lab Results  Component Value Date   HGBA1C 4.8 09/18/2016    Radiology    Portable Chest X-ray 1 View  Result Date: 09/18/2016 CLINICAL DATA:  ST elevation myocardial infarction. EXAM: PORTABLE CHEST 1 VIEW COMPARISON:  None. FINDINGS: The heart size and mediastinal contours are within normal limits. Both lungs are clear. The visualized skeletal structures are unremarkable. IMPRESSION: No active disease. Electronically Signed   By: Myles Rosenthal M.D.   On: 09/18/2016 20:53    Cardiac Studies    Left Heart Cath and Coronary Angiography - 09/18/16  Films personally reviewed Conclusions: 1. Acute inferior STEMI with thrombotic occlusion of the distal RCA. RCA is a large, ectatic, diffusely diseased vessel. Moderate disease involving the LAD and diagonal branches is also evident. 2. Mildly elevated left ventricular filling pressure. 3. Successful PCI with aspiration thrombectomy and drug-eluting stent placement to the mid and distal RCA. Embolization of thrombus occurred with occlusion of the distal most portions of the PDA and PL branches. Distal RCA:  Resolute onyx DES 4.0 mm x 26 mm; mid RCA 4.0 mm 22 mm - postdilated to 4.5 mm  Recommendations: 1. Admit to cardiac ICU for close monitoring. 2. Dual antiplatelet therapy with aspirin and ticagrelor for at least 12 months. 3. Continue ticagrelor infusion for 24 hours. 4. Transthoracic echocardiogram to evaluate LV function. 5. Consider relook catheterization 48-72 hours to reassess thrombus burden and stent apposition, given heavy thrombus burden at the time of intervention. 6. Aggressive secondary prevention.   Diagnostic Diagram       Post-Intervention Diagram  Distal RCA: Resolute onyx DES 4.0 mm x 26 mm; mid RCA 4.0 mm 22 mm - postdilated to 4.5 mm           Echo Pending  Assessment & Plan    1 acute inferior myocardial infarction-patient doing well this morning with no recurrent chest pain. Continue asa, brilinta, metoprolol (increase to 25 mg BID) and statin. I will hold on adding an ACE inhibitor for now as blood pressure is borderline. His ejection fraction is 40-45%. Transfer to telemetry. Per Dr Herbie Baltimore will need follow-up opinion by interventional team concerning need for repeat catheterization Monday morning.  2 thoracic aortic aneurysm-noted on echocardiogram. Would plan MRA following DC to further assess.  3 NSVT-noted on telemetry; increase metoprolol to 25 mg twice a day.  4 hyperlipidemia-continue statin. Check lipids and liver in 4 weeks.  Signed, Olga Millers, MD  09/20/2016, 10:10 AM

## 2016-09-21 MED ORDER — IRBESARTAN 150 MG PO TABS
150.0000 mg | ORAL_TABLET | Freq: Every day | ORAL | Status: DC
  Administered 2016-09-21 – 2016-09-22 (×2): 150 mg via ORAL
  Filled 2016-09-21 (×2): qty 1

## 2016-09-21 NOTE — Progress Notes (Signed)
Progress Note  Patient Name: Patrick Weber Date of Encounter: 09/21/2016  Primary Cardiologist: End  Subjective  Denies any angina. Walked a lap in the CCU yesterday without shortness of breath. Now 2 days status post acute inferior wall myocardial infarction due to thrombotic occlusion of the distal right coronary artery, treated with placement of drug-eluting stents to the RCA, complicated by distal embolization in PDA and PLA. Incidental finding of moderate size ascending aortic aneurysm. Dr. Herbie Baltimore suggested possible need for relook cardiac catheterization in 2-3 days. Cardiac enzymes show only moderate injury with peak troponin around 9, but echo shows a sizable area of wall motion abnormality with LVEF down to 40-45 %.   Inpatient Medications    Scheduled Meds: . aspirin EC  81 mg Oral Daily  . atorvastatin  80 mg Oral q1800  . enoxaparin (LOVENOX) injection  40 mg Subcutaneous Q24H  . metoprolol tartrate  25 mg Oral BID  . sodium chloride flush  3 mL Intravenous Q12H  . ticagrelor  90 mg Oral Q12H   Continuous Infusions: . sodium chloride     PRN Meds: sodium chloride, acetaminophen, ALPRAZolam, nitroGLYCERIN, ondansetron (ZOFRAN) IV, sodium chloride flush, zolpidem   Vital Signs    Vitals:   09/20/16 1100 09/20/16 1251 09/20/16 1900 09/21/16 0500  BP: 101/76 115/77 108/80 116/80  Pulse: 86 72 76 91  Resp:  18 18 18   Temp:  97.7 F (36.5 C) 98.2 F (36.8 C) 97.8 F (36.6 C)  TempSrc:  Oral Oral Oral  SpO2:  98% 99% 97%  Weight:  189 lb 1.6 oz (85.8 kg)  189 lb 4.8 oz (85.9 kg)  Height:  6\' 1"  (1.854 m)      Intake/Output Summary (Last 24 hours) at 09/21/16 1026 Last data filed at 09/21/16 0600  Gross per 24 hour  Intake              723 ml  Output             1000 ml  Net             -277 ml   Filed Weights   09/20/16 0700 09/20/16 1251 09/21/16 0500  Weight: 189 lb (85.7 kg) 189 lb 1.6 oz (85.8 kg) 189 lb 4.8 oz (85.9 kg)    Telemetry    Sinus  rhythm - Personally Reviewed  ECG    Sinus rhythm, evolving changes of inferior MI with Q waves and minimal residual ST segment elevation, prolonged QTc 507 ms - Personally Reviewed  Physical Exam  Appears well, very calm and comfortable GEN: No acute distress.   Neck: No JVD Cardiac: RRR, no murmurs, rubs. S4 is present. Radial access site healthy with small ecchymosis  Respiratory: Clear to auscultation bilaterally. GI: Soft, nontender, non-distended  MS: No edema; No deformity. Neuro:  Nonfocal  Psych: Normal affect   Labs    Chemistry Recent Labs Lab 09/18/16 1632 09/18/16 1942 09/19/16 0627  NA 140 138 138  K 3.5 3.8 3.6  CL 107 109 110  CO2  --  20* 20*  GLUCOSE 119* 135* 116*  BUN 17 13 11   CREATININE 0.80 0.93 0.88  CALCIUM  --  8.1* 8.2*  PROT  --  6.3*  --   ALBUMIN  --  3.5  --   AST  --  35  --   ALT  --  16*  --   ALKPHOS  --  55  --   BILITOT  --  0.8  --   GFRNONAA  --  >60 >60  GFRAA  --  >60 >60  ANIONGAP  --  9 8     Hematology Recent Labs Lab 09/18/16 1632 09/18/16 1942 09/19/16 0627  WBC  --  9.7 8.5  RBC  --  4.60 4.37  HGB 13.6 14.2 13.3  HCT 40.0 40.2 39.0  MCV  --  87.4 89.2  MCH  --  30.9 30.4  MCHC  --  35.3 34.1  RDW  --  13.3 13.5  PLT  --  210 177    Cardiac Enzymes Recent Labs Lab 09/18/16 1942 09/19/16 0038 09/19/16 0627  TROPONINI 1.92* 5.11* 9.61*   No results for input(s): TROPIPOC in the last 168 hours.   BNPNo results for input(s): BNP, PROBNP in the last 168 hours.   DDimer No results for input(s): DDIMER in the last 168 hours.   Radiology    No results found.  Cardiac Studies  CATH 09/18/16           Echo 09/19/16 - Left ventricle: The cavity size was mildly dilated. Wall   thickness was increased in a pattern of mild LVH. Systolic   function was mildly to moderately reduced. The estimated ejection   fraction was in the range of 40% to 45%. There is akinesis of the   inferolateral myocardium.  There is hypokinesis of the inferior   myocardium. Doppler parameters are consistent with abnormal left   ventricular relaxation (grade 1 diastolic dysfunction). - Aortic valve: There was mild regurgitation. - Aortic root: The aortic root was moderately dilated. - Mitral valve: There was mild regurgitation. Valve area by   pressure half-time: 1.64 cm^2.  Impressions:  - Akinesis of the inferolateral wall and hypokinesis of the   inferior wall; overall mild to moderate LV dysfunction; mild   diastolic dysfunction; mild LVH and LVE; moderately dilated   aortic root (4.7 cm; suggest CTA to further assess); mild AI;   mild MR.   Patient Profile     71 y.o. male acute inferior wall STEMI due to RCA occlusion with high thrombus burden, treated with placement of drug-eluting stents, with significant distal embolization. Incidental note of ascending aortic aneurysm of moderate size.  Assessment & Plan    1. CAD s/p STEMI - Cardiac enzymes show only moderate injury with peak troponin around 9, but echo shows a sizable area of wall motion abnormality with LVEF down to 40-45 %. Suspect he has extensive sun myocardium that will recover. Reassess left ventricular systolic function several weeks.  Dr. Herbie BaltimoreHarding suggested he may need a "relook" coronary angiogram in 2-3 days. Clinically he is doing very well. I'm not sure this is absolutely necessary but will keep NPO after midnight for possible procedure tomorrow, after reassessment by interventional cardiologist. Reviewed mandatory dual antiplatelet therapy for 12 months. He is already on beta blockers. Will add a low dose of angiotensin receptor blocker due to left ventricular systolic dysfunction. On high-dose statin. 2. LV dysfunction -  despite low EF he does not have any clinical manifestations of congestive heart failure. Warned him to pay attention to any symptoms of exertional dyspnea and report them promptly. 3. Ascending aortic aneurysm -  plan outpatient CT angiogram of the aorta  Signed, Thurmon FairMihai Weylyn Ricciuti, MD  09/21/2016, 10:26 AM

## 2016-09-22 ENCOUNTER — Encounter (HOSPITAL_COMMUNITY): Payer: Self-pay | Admitting: Cardiology

## 2016-09-22 DIAGNOSIS — L8990 Pressure ulcer of unspecified site, unspecified stage: Secondary | ICD-10-CM | POA: Insufficient documentation

## 2016-09-22 LAB — BASIC METABOLIC PANEL
ANION GAP: 9 (ref 5–15)
BUN: 15 mg/dL (ref 6–20)
CHLORIDE: 106 mmol/L (ref 101–111)
CO2: 24 mmol/L (ref 22–32)
Calcium: 8.8 mg/dL — ABNORMAL LOW (ref 8.9–10.3)
Creatinine, Ser: 0.85 mg/dL (ref 0.61–1.24)
GFR calc Af Amer: >60 mL/min (ref >60)
GLUCOSE: 91 mg/dL (ref 65–99)
POTASSIUM: 3.8 mmol/L (ref 3.5–5.1)
Sodium: 139 mmol/L (ref 135–145)

## 2016-09-22 LAB — CBC
HEMATOCRIT: 42.8 % (ref 39.0–52.0)
Hemoglobin: 14.9 g/dL (ref 13.0–17.0)
MCH: 30.8 pg (ref 26.0–34.0)
MCHC: 34.8 g/dL (ref 30.0–36.0)
MCV: 88.4 fL (ref 78.0–100.0)
Platelets: 194 10*3/uL (ref 150–400)
RBC: 4.84 MIL/uL (ref 4.22–5.81)
RDW: 13 % (ref 11.5–15.5)
WBC: 6.1 10*3/uL (ref 4.0–10.5)

## 2016-09-22 MED ORDER — ASPIRIN 81 MG PO TBEC: 81.0000 mg | DELAYED_RELEASE_TABLET | Freq: Every day | ORAL | Status: DC

## 2016-09-22 MED ORDER — TICAGRELOR 90 MG PO TABS: 90.0000 mg | ORAL_TABLET | Freq: Two times a day (BID) | ORAL | 0 refills | Status: DC

## 2016-09-22 MED ORDER — NITROGLYCERIN 0.4 MG SL SUBL: 0.4000 mg | SUBLINGUAL_TABLET | SUBLINGUAL | 2 refills | Status: DC | PRN

## 2016-09-22 MED ORDER — METOPROLOL TARTRATE 25 MG PO TABS: 25.0000 mg | ORAL_TABLET | Freq: Two times a day (BID) | ORAL | 3 refills | Status: DC

## 2016-09-22 MED ORDER — TICAGRELOR 90 MG PO TABS: 90.0000 mg | ORAL_TABLET | Freq: Two times a day (BID) | ORAL | 10 refills | Status: DC

## 2016-09-22 MED ORDER — ATORVASTATIN CALCIUM 80 MG PO TABS: 80.0000 mg | ORAL_TABLET | Freq: Every day | ORAL | 6 refills | Status: DC

## 2016-09-22 MED ORDER — IRBESARTAN 150 MG PO TABS: 150.0000 mg | ORAL_TABLET | Freq: Every day | ORAL | 3 refills | Status: DC

## 2016-09-22 NOTE — Care Management Important Message (Signed)
Important Message  Patient Details  Name: Patrick Weber MRN: [id number] Date of Birth: 03/14/1946   Medicare Important Message Given:  Yes    Kyla BalzarineShealy, Maydelin Deming Abena 09/22/2016, 1:02 PM

## 2016-09-22 NOTE — Progress Notes (Signed)
CARDIAC REHAB PHASE I   PRE:  Rate/Rhythm: 100 SR  BP:  Sitting: 105/82        SaO2: 98 RA  MODE:  Ambulation: 750 ft   POST:  Rate/Rhythm: 118 ST  BP:  Sitting: 125/85         SaO2: 100 RA  Pt ambulated 750 ft on RA, hand held assist, steady gait, tolerated well with no complaints. Completed MI/stent education. Reviewed previous education, discussed exercise guidelines, s/s heart failure, daily weights, phase 2 cardiac rehab, and heart healthy diet. Pt verbalized understanding. Pt agrees to phase 2 cardiac rehab referral, will send to [facility] per pt request. Pt to recliner after walk, call bell within reach. Will follow if pt does not discharge today.   [id number] Patrick GrapesEmily C Gennaro Lizotte, RN, BSN 09/22/2016 10:03 AM

## 2016-09-22 NOTE — Discharge Summary (Signed)
Discharge Summary    Patient ID: Patrick Weber,  MRN: [id number], DOB/AGE: [birth year] 71 y.o.  Admit date: 09/18/2016 Discharge date: 09/22/2016  Primary Care Provider: No primary care provider on file. Primary Cardiologist: End   Discharge Diagnoses    Principal Problem:   Acute ST elevation myocardial infarction (STEMI) of inferior wall (HCC) Active Problems:   Coronary artery disease involving native coronary artery of native heart with unstable angina pectoris (HCC)   Dyslipidemia, goal LDL below 70   Hyperglycemia   Pressure injury of skin   Allergies Allergies  Allergen Reactions  . Lidocaine Other (See Comments)    Makes him sleepy.    Diagnostic Studies/Procedures    CATH 09/18/16   Echo 09/19/16 - Left ventricle: The cavity size was mildly dilated. Wall thickness was increased in a pattern of mild LVH. Systolic function was mildly to moderately reduced. The estimated ejection fraction was in the range of 40% to 45%. There is akinesis of the inferolateral myocardium. There is hypokinesis of the inferior myocardium. Doppler parameters are consistent with abnormal left ventricular relaxation (grade 1 diastolic dysfunction). - Aortic valve: There was mild regurgitation. - Aortic root: The aortic root was moderately dilated. - Mitral valve: There was mild regurgitation. Valve area by pressure half-time: 1.64 cm^2.  Impressions:  - Akinesis of the inferolateral wall and hypokinesis of the inferior wall; overall mild to moderate LV dysfunction; mild diastolic dysfunction; mild LVH and LVE; moderately dilated aortic root (4.7 cm; suggest CTA to further assess); mild AI; mild MR. _____________   History of Present Illness     71 year old man with no significant past medical history, who presented to his PCP 09/18/16 because of 2 hours of chest pain and nausea. There, he was found to have inferior ST segment elevation,  prompting referral to [facility] via EMS for emergent left heart catheterization and possible PCI. He was found to have thrombotic occlusion of the distal RCA, which is a large, ectatic, and diffusely diseased vessel. This was successfully treated with 2 drug-eluting stents as well as aspiration thrombectomy. Embolization of thrombus into the distal branches was evident. The patient also had mild to moderate disease involving the LAD and its branches.  Hospital Course      He was placed on DAPT with ASA/Brilinta. His metoprolol was increased to 25mg  BID and high dose statin started. Echo showed 40-45% with WMA. It was suggested that a "relook" cath be considered if the patient had any recurrent chest pain. Overall, he felt very well post cath. Labs were stable. Troponin peaked at 9.61. Discussed the need for daily weights. Plan for repeat echo as outpatient. ARB was added, and his blood pressure tolerated well. Worked well with cardiac rehab. He was instructed on the s/s of HF, and educated on the need for daily weights. There was a question of descending thoracic AA, which will be addressed with outpatient CT angiogram.   He was seen and assessed by Dr. Allyson Sabal and determined stable for discharge home. Follow up in the office has been arranged. Medications are listed below.  _____________  Discharge Vitals Blood pressure 101/75, pulse 80, temperature 97.7 F (36.5 C), temperature source Oral, resp. rate 17, height 6\' 1"  (1.854 m), weight 187 lb 8 oz (85 kg), SpO2 100 %.  Filed Weights   09/20/16 1251 09/21/16 0500 09/22/16 0500  Weight: 189 lb 1.6 oz (85.8 kg) 189 lb 4.8 oz (85.9 kg) 187 lb 8 oz (85 kg)  Labs & Radiologic Studies    CBC  Recent Labs  09/22/16 0828  WBC 6.1  HGB 14.9  HCT 42.8  MCV 88.4  PLT 194   Basic Metabolic Panel  Recent Labs  09/22/16 0828  NA 139  K 3.8  CL 106  CO2 24  GLUCOSE 91  BUN 15  CREATININE 0.85  CALCIUM 8.8*   Liver Function Tests No  results for input(s): AST, ALT, ALKPHOS, BILITOT, PROT, ALBUMIN in the last 72 hours. No results for input(s): LIPASE, AMYLASE in the last 72 hours. Cardiac Enzymes No results for input(s): CKTOTAL, CKMB, CKMBINDEX, TROPONINI in the last 72 hours. BNP Invalid input(s): POCBNP D-Dimer No results for input(s): DDIMER in the last 72 hours. Hemoglobin A1C No results for input(s): HGBA1C in the last 72 hours. Fasting Lipid Panel No results for input(s): CHOL, HDL, LDLCALC, TRIG, CHOLHDL, LDLDIRECT in the last 72 hours. Thyroid Function Tests No results for input(s): TSH, T4TOTAL, T3FREE, THYROIDAB in the last 72 hours.  Invalid input(s): FREET3 _____________  Portable Chest X-ray 1 View  Result Date: 09/18/2016 CLINICAL DATA:  ST elevation myocardial infarction. EXAM: PORTABLE CHEST 1 VIEW COMPARISON:  None. FINDINGS: The heart size and mediastinal contours are within normal limits. Both lungs are clear. The visualized skeletal structures are unremarkable. IMPRESSION: No active disease. Electronically Signed   By: Myles Rosenthal M.D.   On: 09/18/2016 20:53   Disposition   Pt is being discharged home today in good condition.  Follow-up Plans & Appointments    Follow-up Information    [place], Sharrell Ku, [place] Follow up on 10/01/2016.   Specialty:  Cardiology Why:  at 8am for your follow up appt.  Contact information: [address] [place] [address] [phone]          Discharge Instructions    (HEART FAILURE PATIENTS) Call MD:  Anytime you have any of the following symptoms: 1) 3 pound weight gain in 24 hours or 5 pounds in 1 week 2) shortness of breath, with or without a dry hacking cough 3) swelling in the hands, feet or stomach 4) if you have to sleep on extra pillows at night in order to breathe.    Complete by:  As directed    Amb Referral to Cardiac Rehabilitation    Complete by:  As directed    Diagnosis:   Coronary Stents STEMI     Call MD for:  redness,  tenderness, or signs of infection (pain, swelling, redness, odor or green/yellow discharge around incision site)    Complete by:  As directed    Diet - low sodium heart healthy    Complete by:  As directed    Discharge instructions    Complete by:  As directed    Radial Site Care Refer to this sheet in the next few weeks. These instructions provide you with information on caring for yourself after your procedure. Your caregiver may also give you more specific instructions. Your treatment has been planned according to current medical practices, but problems sometimes occur. Call your caregiver if you have any problems or questions after your procedure. HOME CARE INSTRUCTIONS You may shower the day after the procedure.Remove the bandage (dressing) and gently wash the site with plain soap and water.Gently pat the site dry.  Do not apply powder or lotion to the site.  Do not submerge the affected site in water for 3 to 5 days.  Inspect the site at least twice daily.  Do  not flex or bend the affected arm for 24 hours.  No lifting over 5 pounds (2.3 kg) for 5 days after your procedure.  Do not drive home if you are discharged the same day of the procedure. Have someone else drive you.  You may drive 24 hours after the procedure unless otherwise instructed by your caregiver.  What to expect: Any bruising will usually fade within 1 to 2 weeks.  Blood that collects in the tissue (hematoma) may be painful to the touch. It should usually decrease in size and tenderness within 1 to 2 weeks.  SEEK IMMEDIATE MEDICAL CARE IF: You have unusual pain at the radial site.  You have redness, warmth, swelling, or pain at the radial site.  You have drainage (other than a small amount of blood on the dressing).  You have chills.  You have a fever or persistent symptoms for more than 72 hours.  You have a fever and your symptoms suddenly get worse.  Your arm becomes pale, cool, tingly, or numb.  You have heavy  bleeding from the site. Hold pressure on the site.   Increase activity slowly    Complete by:  As directed       Discharge Medications   Current Discharge Medication List    START taking these medications   Details  aspirin EC 81 MG EC tablet Take 1 tablet (81 mg total) by mouth daily.    atorvastatin (LIPITOR) 80 MG tablet Take 1 tablet (80 mg total) by mouth daily at 6 PM. Qty: 30 tablet, Refills: 6    irbesartan (AVAPRO) 150 MG tablet Take 1 tablet (150 mg total) by mouth daily. Qty: 30 tablet, Refills: 3    metoprolol tartrate (LOPRESSOR) 25 MG tablet Take 1 tablet (25 mg total) by mouth 2 (two) times daily. Qty: 60 tablet, Refills: 3    nitroGLYCERIN (NITROSTAT) 0.4 MG SL tablet Place 1 tablet (0.4 mg total) under the tongue every 5 (five) minutes x 3 doses as needed for chest pain. Qty: 25 tablet, Refills: 2    ticagrelor (BRILINTA) 90 MG TABS tablet Take 1 tablet (90 mg total) by mouth every 12 (twelve) hours. Qty: 60 tablet, Refills: 0      STOP taking these medications     naproxen sodium (ANAPROX) 220 MG tablet          Aspirin prescribed at discharge?  Yes High Intensity Statin Prescribed? (Lipitor 40-80mg  or Crestor 20-40mg ): Yes Beta Blocker Prescribed? Yes For EF <40%, was ACEI/ARB Prescribed? Yes ADP Receptor Inhibitor Prescribed? (i.e. Plavix etc.-Includes Medically Managed Patients): Yes For EF <40%, Aldosterone Inhibitor Prescribed? No: EF ok Was EF assessed during THIS hospitalization? Yes Was Cardiac Rehab II ordered? (Included Medically managed Patients): Yes   Outstanding Labs/Studies   Echo in 6 week, outpatient CTA.   Duration of Discharge Encounter   Greater than 30 minutes including physician time.  Signed, Laverda PageLindsay Edrei Norgaard NP-C 09/22/2016, 11:02 AM

## 2016-09-22 NOTE — Progress Notes (Signed)
Progress Note  Patient Name: Patrick Weber Date of Encounter: 09/22/2016  Primary Cardiologist: End  Subjective   Feeling well this morning. No chest pain, or dyspnea.   Inpatient Medications    Scheduled Meds: . aspirin EC  81 mg Oral Daily  . atorvastatin  80 mg Oral q1800  . enoxaparin (LOVENOX) injection  40 mg Subcutaneous Q24H  . irbesartan  150 mg Oral Daily  . metoprolol tartrate  25 mg Oral BID  . sodium chloride flush  3 mL Intravenous Q12H  . ticagrelor  90 mg Oral Q12H   Continuous Infusions: . sodium chloride     PRN Meds: sodium chloride, acetaminophen, ALPRAZolam, nitroGLYCERIN, ondansetron (ZOFRAN) IV, sodium chloride flush, zolpidem   Vital Signs    Vitals:   09/21/16 1535 09/21/16 1905 09/21/16 2100 09/22/16 0500  BP: 106/72 94/68 110/79 101/75  Pulse: 80 100  80  Resp: 18 18  17   Temp: 98.1 F (36.7 C) 97.6 F (36.4 C)  97.7 F (36.5 C)  TempSrc: Oral Oral  Oral  SpO2: 100% 100%  100%  Weight:    187 lb 8 oz (85 kg)  Height:        Intake/Output Summary (Last 24 hours) at 09/22/16 0805 Last data filed at 09/22/16 0500  Gross per 24 hour  Intake              720 ml  Output             1000 ml  Net             -280 ml   Filed Weights   09/20/16 1251 09/21/16 0500 09/22/16 0500  Weight: 189 lb 1.6 oz (85.8 kg) 189 lb 4.8 oz (85.9 kg) 187 lb 8 oz (85 kg)    Telemetry    SR with PVCs - Personally Reviewed  ECG    N/A - Personally Reviewed  Physical Exam   General: Well developed, well nourished, male appearing in no acute distress. Head: Normocephalic, atraumatic.  Neck: Supple without bruits, JVD. Lungs:  Resp regular and unlabored, CTA. Heart: RRR, S1, S2, no S3, S4, or murmur; no rub. Abdomen: Soft, non-tender, non-distended with normoactive bowel sounds. No hepatomegaly. No rebound/guarding. No obvious abdominal masses. Extremities: No clubbing, cyanosis, edema. Distal pedal pulses are 2+ bilaterally. Right radial site  stable, mild bruising.  Neuro: Alert and oriented X 3. Moves all extremities spontaneously. Psych: Normal affect.  Labs    Chemistry Recent Labs Lab 09/18/16 1632 09/18/16 1942 09/19/16 0627  NA 140 138 138  K 3.5 3.8 3.6  CL 107 109 110  CO2  --  20* 20*  GLUCOSE 119* 135* 116*  BUN 17 13 11   CREATININE 0.80 0.93 0.88  CALCIUM  --  8.1* 8.2*  PROT  --  6.3*  --   ALBUMIN  --  3.5  --   AST  --  35  --   ALT  --  16*  --   ALKPHOS  --  55  --   BILITOT  --  0.8  --   GFRNONAA  --  >60 >60  GFRAA  --  >60 >60  ANIONGAP  --  9 8     Hematology Recent Labs Lab 09/18/16 1632 09/18/16 1942 09/19/16 0627  WBC  --  9.7 8.5  RBC  --  4.60 4.37  HGB 13.6 14.2 13.3  HCT 40.0 40.2 39.0  MCV  --  87.4 89.2  MCH  --  30.9 30.4  MCHC  --  35.3 34.1  RDW  --  13.3 13.5  PLT  --  210 177    Cardiac Enzymes Recent Labs Lab 09/18/16 1942 09/19/16 0038 09/19/16 0627  TROPONINI 1.92* 5.11* 9.61*   No results for input(s): TROPIPOC in the last 168 hours.   BNPNo results for input(s): BNP, PROBNP in the last 168 hours.   DDimer No results for input(s): DDIMER in the last 168 hours.    Radiology    No results found.  Cardiac Studies   CATH 09/18/16   Echo 09/19/16 - Left ventricle: The cavity size was mildly dilated. Wall thickness was increased in a pattern of mild LVH. Systolic function was mildly to moderately reduced. The estimated ejection fraction was in the range of 40% to 45%. There is akinesis of the inferolateral myocardium. There is hypokinesis of the inferior myocardium. Doppler parameters are consistent with abnormal left ventricular relaxation (grade 1 diastolic dysfunction). - Aortic valve: There was mild regurgitation. - Aortic root: The aortic root was moderately dilated. - Mitral valve: There was mild regurgitation. Valve area by pressure half-time: 1.64 cm^2.  Impressions:  - Akinesis of the inferolateral wall  and hypokinesis of the inferior wall; overall mild to moderate LV dysfunction; mild diastolic dysfunction; mild LVH and LVE; moderately dilated aortic root (4.7 cm; suggest CTA to further assess); mild AI; mild MR.  Patient Profile     71 y.o. male acute inferior wall STEMI due to RCA occlusion with high thrombus burden, treated with placement of drug-eluting stents, with significant distal embolization. Incidental note of ascending aortic aneurysm of moderate size.   Assessment & Plan    1. CAD s/p STEMI -  Has DES placed to the Mid/Distal RCA. Troponin peaked at 9.61. Echo showed a sizable area of wall motion abnormality with LVEF down to 40-45%. Plan for repeat echo in 6 weeks to follow up LV function. Plan for DAPT with ASA and Brilinta. -- It was suggested for possible "relook" cath today. Patient currently NPO. Follow up with MD regarding this plan. Suspect no further intervention as he has felt well, and looks good clinically.  -- on BB, ARB (check labs this morning)  2. LV dysfunction -  no signs of HF note. Discussed the need for daily weights. Plan for repeat echo as outpatient.   3. Ascending aortic aneurysm - planned for outpatient CT angiogram of the aorta  4. HL: On statin  Signed, Laverda PageLindsay Roberts, NP  09/22/2016, 8:05 AM    Agree with note by Laverda PageLindsay Roberts NP-C  Postop day #4 inferior STEMI, complicated by distal embolization. He had 2 drug-eluting stents placed in his mid and distal dominant RCA. He did have moderate LV dysfunction with an EF in the 40-45% range. He has remained asymptomatic. His exam is benign. He is ambulating without difficulty. There is a question of a descending thoracic aortic aneurysm which can be evaluated as an outpatient by CT angiogram. I do not think he needs repeat catheterization to look at his PDA since its occluded in its distal portion and probably would not require intervention. He will need to antiplatelet uninterrupted for at  least a year. He'll be discharged home today with follow-up with a mid-level provider in 7-10 days and with a cardiologist in 3-4 weeks.   Runell GessJonathan J. Berry, M.D., FACP, [facility] CenterFACC, Earl LagosFAHA, [facility] PLLCFSCAI [facility] [address] [place], [place]  [id number]  [phone]  09/22/2016 10:37 AM

## 2016-09-25 NOTE — Progress Notes (Signed)
Cardiology Office Note    Date:  10/01/2016   ID:  Patrick Weber, DOB 02/24/1946, MRN [id number]  PCP:  Joette CatchingNyland, Leonard, MD  Cardiologist:  Dr. Okey DupreEnd  Chief Complaint: Hospital follow up for STEMI  History of Present Illness:   Patrick Weber is a 71 y.o. male for hospital follow up.   No significant PMH. Admitted 09/18/16 with inferior STEMI. Cath showed thrombotic occlusion of the distal RCA, which is a large, ectatic, and diffusely diseased vessel. This was successfully treated with 2 drug-eluting stents as well as aspiration thrombectomy. Embolization of thrombus into the distal branches was evident. The patient also had mild to moderate disease involving the LAD and its branches. Troponin peaked at 9.61. Echo showed 40-45% with WMA. There was a question of descending thoracic AA, which will be addressed with outpatient CT angiogram.  It was suggested that a "relook" cath be considered if the patient had any recurrent chest pain.  Here today for follow up. He is walking 1/2 mile a day without chest pain or dyspnea. He will enroll in CRP II. The patient denies nausea, vomiting, fever, chest pain, palpitations, shortness of breath, orthopnea, PND, dizziness, syncope, cough, congestion, abdominal pain, hematochezia, melena, lower extremity edema. Compliant with medications.    Past Medical History:  Diagnosis Date  . Acute ST elevation myocardial infarction (STEMI) of inferior wall (HCC) 09/18/2016  . 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   . Hyperlipemia     Past Surgical History:  Procedure Laterality Date  . LEFT HEART CATH AND CORONARY ANGIOGRAPHY N/A 09/18/2016   Procedure: Left Heart Cath and Coronary Angiography;  Surgeon: Yvonne KendallEnd, Christopher, MD;  Location: [facility] INVASIVE CV LAB;  Service: Cardiovascular;  Laterality: N/A;    Current  Medications: Prior to Admission medications   Medication Sig Start Date End Date Taking? Authorizing Provider  aspirin EC 81 MG EC tablet Take 1 tablet (81 mg total) by mouth daily. 09/23/16   Arty Baumgartneroberts, Lindsay B, NP  atorvastatin (LIPITOR) 80 MG tablet Take 1 tablet (80 mg total) by mouth daily at 6 PM. 09/22/16   Arty Baumgartneroberts, Lindsay B, NP  irbesartan (AVAPRO) 150 MG tablet Take 1 tablet (150 mg total) by mouth daily. 09/23/16   Arty Baumgartneroberts, Lindsay B, NP  metoprolol tartrate (LOPRESSOR) 25 MG tablet Take 1 tablet (25 mg total) by mouth 2 (two) times daily. 09/22/16   Arty Baumgartneroberts, Lindsay B, NP  nitroGLYCERIN (NITROSTAT) 0.4 MG SL tablet Place 1 tablet (0.4 mg total) under the tongue every 5 (five) minutes x 3 doses as needed for chest pain. 09/22/16   Arty Baumgartneroberts, Lindsay B, NP  ticagrelor (BRILINTA) 90 MG TABS tablet Take 1 tablet (90 mg total) by mouth every 12 (twelve) hours. 09/22/16   Arty Baumgartneroberts, Lindsay B, NP    Allergies:   Lidocaine   Social History   Social History  . Marital status: Married    Spouse name: N/A  . Number of children: N/A  . Years of education: N/A   Social History Main Topics  . Smoking status: Never Smoker  . Smokeless tobacco: Never Used  . Alcohol use None  . Drug use: No  . Sexual activity: Not Asked   Other Topics Concern  . None   Social History Narrative  . None     Family History:  The patient's family history includes Emphysema  in his father; Heart disease in his mother.   ROS:   Please see the history of present illness.    ROS All other systems reviewed and are negative.   PHYSICAL EXAM:   VS:  BP 130/90   Pulse 71   Ht 6\' 1"  (1.854 m)   Wt 190 lb 6.4 oz (86.4 kg)   BMI 25.12 kg/m    GEN: Well nourished, well developed, in no acute distress  HEENT: normal  Neck: no JVD, carotid bruits, or masses Cardiac: RRR; no murmurs, rubs, or gallops,no edema  Respiratory:  clear to auscultation bilaterally, normal work of breathing GI: soft, nontender,  nondistended, + BS MS: no deformity or atrophy  Skin: warm and dry, no rash Neuro:  Alert and Oriented x 3, Strength and sensation are intact Psych: euthymic mood, full affect  Wt Readings from Last 3 Encounters:  10/01/16 190 lb 6.4 oz (86.4 kg)  09/22/16 187 lb 8 oz (85 kg)      Studies/Labs Reviewed:   EKG:  EKG is ordered today.  The ekg ordered today demonstrates NSR at rate of 78 bpm. PVCs. TWI in inferior lateral leads.   Recent Labs: 09/18/2016: ALT 16; Magnesium 2.0; TSH 0.978 09/22/2016: BUN 15; Creatinine, Ser 0.85; Hemoglobin 14.9; Platelets 194; Potassium 3.8; Sodium 139   Lipid Panel    Component Value Date/Time   CHOL 159 09/19/2016 0041   TRIG 69 09/19/2016 0041   HDL 36 (L) 09/19/2016 0041   CHOLHDL 4.4 09/19/2016 0041   VLDL 14 09/19/2016 0041   LDLCALC 109 (H) 09/19/2016 0041    Additional studies/ records that were reviewed today include:    Echo 09/19/16 - Left ventricle: The cavity size was mildly dilated. Wall thickness was increased in a pattern of mild LVH. Systolic function was mildly to moderately reduced. The estimated ejection fraction was in the range of 40% to 45%. There is akinesis of the inferolateral myocardium. There is hypokinesis of the inferior myocardium. Doppler parameters are consistent with abnormal left ventricular relaxation (grade 1 diastolic dysfunction). - Aortic valve: There was mild regurgitation. - Aortic root: The aortic root was moderately dilated. - Mitral valve: There was mild regurgitation. Valve area by pressure half-time: 1.64 cm^2.  Impressions:  - Akinesis of the inferolateral wall and hypokinesis of the inferior wall; overall mild to moderate LV dysfunction; mild diastolic dysfunction; mild LVH and LVE; moderately dilated aortic root (4.7 cm; suggest CTA to further assess); mild AI; mild MR. _____________   Cardiac Catheterization:  09/18/16 Conclusions: 1. Acute inferior STEMI  with thrombotic occlusion of the distal RCA. RCA is a large, ectatic, diffusely diseased vessel. Moderate disease involving the LAD and diagonal branches is also evident. 2. Mildly elevated left ventricular filling pressure. 3. Successful PCI with aspiration thrombectomy and drug-eluting stent placement to the mid and distal RCA. Embolization of thrombus occurred with occlusion of the distal most portions of the PDA and PL branches.  Recommendations: 1. Admit to cardiac ICU for close monitoring. 2. Dual antiplatelet therapy with aspirin and ticagrelor for at least 12 months. 3. Continue ticagrelor infusion for 24 hours. 4. Transthoracic echocardiogram to evaluate LV function. 5. Consider relook catheterization 48-72 hours to reassess thrombus burden and stent apposition, given heavy thrombus burden at the time of intervention. 6. Aggressive secondary prevention.  Diagnostic Diagram       Post-Intervention Diagram           ASSESSMENT & PLAN:    1. CAD with recent  STEMI s/p  RCA intervention with "full metal jacket" with drug-eluting stents. There was distal embolization - No angina. Continue ASA, Brillinta, statin, BB and ARB.   2. Moderately dilated aortic root (4.7 cm)  - Will do CTA of aorta to further assess.  3. Mild LV dysfunction - Echo showed 40-45% with WMA. Heart Failure education given.  Consider repeat echo in few months.   4. HLD - 09/19/2016: Cholesterol 159; HDL 36; LDL Cholesterol 109; Triglycerides 69; VLDL 14  - Continue statin. Repeat LFT and Lipid panel in 6 weeks.   Medication Adjustments/Labs and Tests Ordered: Current medicines are reviewed at length with the patient today.  Concerns regarding medicines are outlined above.  Medication changes, Labs and Tests ordered today are listed in the Patient Instructions below. Patient Instructions  Your physician recommends that you continue on your current medications as directed. Please refer to the Current  Medication list given to you today.  Non-Cardiac CT Angiography (CTA), is a special type of CT scan that uses a computer to produce multi-dimensional views of major blood vessels throughout the body. In CT angiography, a contrast material is injected through an IV to help visualize the blood vessels  Your physician recommends that you return for lab work in: 6 weeks. (lipids, liver function)  Your physician recommends that you schedule a follow-up appointment in: 3 months with Dr. Okey Dupre.      Lorelei Pont, [place]  10/01/2016 8:22 AM    [facility] [address] [place], [place], [place]  [address] Phone: [phone]; Fax: [phone]

## 2016-09-30 ENCOUNTER — Encounter: Payer: Self-pay | Admitting: Physician Assistant

## 2016-10-01 ENCOUNTER — Ambulatory Visit (INDEPENDENT_AMBULATORY_CARE_PROVIDER_SITE_OTHER): Payer: Medicare HMO | Admitting: Physician Assistant

## 2016-10-01 ENCOUNTER — Encounter: Payer: Self-pay | Admitting: Physician Assistant

## 2016-10-01 VITALS — BP 130/90 | HR 71 | Ht 73.0 in | Wt 190.4 lb

## 2016-10-01 DIAGNOSIS — I5022 Chronic systolic (congestive) heart failure: Secondary | ICD-10-CM | POA: Diagnosis not present

## 2016-10-01 DIAGNOSIS — I77810 Thoracic aortic ectasia: Secondary | ICD-10-CM

## 2016-10-01 DIAGNOSIS — E785 Hyperlipidemia, unspecified: Secondary | ICD-10-CM

## 2016-10-01 DIAGNOSIS — I2119 ST elevation (STEMI) myocardial infarction involving other coronary artery of inferior wall: Secondary | ICD-10-CM

## 2016-10-01 NOTE — Patient Instructions (Addendum)
Your physician recommends that you continue on your current medications as directed. Please refer to the Current Medication list given to you today.  Non-Cardiac CT Angiography (CTA), is a special type of CT scan that uses a computer to produce multi-dimensional views of major blood vessels throughout the body. In CT angiography, a contrast material is injected through an IV to help visualize the blood vessels  Your physician recommends that you return for lab work in: 6 weeks. (lipids, liver function)  Your physician recommends that you schedule a follow-up appointment in: 3 months with Dr. Okey DupreEnd.

## 2016-10-08 ENCOUNTER — Ambulatory Visit (INDEPENDENT_AMBULATORY_CARE_PROVIDER_SITE_OTHER)
Admission: RE | Admit: 2016-10-08 | Discharge: 2016-10-08 | Disposition: A | Discharge status: Home / Self Care | Payer: Medicare HMO | Source: Ambulatory Visit | Attending: Physician Assistant | Admitting: Physician Assistant

## 2016-10-08 DIAGNOSIS — I2119 ST elevation (STEMI) myocardial infarction involving other coronary artery of inferior wall: Secondary | ICD-10-CM

## 2016-10-08 DIAGNOSIS — I77810 Thoracic aortic ectasia: Secondary | ICD-10-CM | POA: Diagnosis not present

## 2016-10-08 DIAGNOSIS — E785 Hyperlipidemia, unspecified: Secondary | ICD-10-CM

## 2016-10-08 MED ORDER — IOPAMIDOL (ISOVUE-370) INJECTION 76%
100.0000 mL | Freq: Once | INTRAVENOUS | Status: AC | PRN
  Administered 2016-10-08: 100 mL via INTRAVENOUS

## 2016-11-12 ENCOUNTER — Other Ambulatory Visit: Payer: Medicare HMO | Admitting: *Deleted

## 2016-11-12 DIAGNOSIS — I2119 ST elevation (STEMI) myocardial infarction involving other coronary artery of inferior wall: Secondary | ICD-10-CM

## 2016-11-12 DIAGNOSIS — E785 Hyperlipidemia, unspecified: Secondary | ICD-10-CM

## 2016-11-12 DIAGNOSIS — I77810 Thoracic aortic ectasia: Secondary | ICD-10-CM

## 2016-11-12 LAB — LIPID PANEL
CHOL/HDL RATIO: 2.8 ratio (ref 0.0–5.0)
Cholesterol, Total: 116 mg/dL (ref 100–199)
HDL: 42 mg/dL (ref >39)
LDL Calculated: 64 mg/dL (ref 0–99)
TRIGLYCERIDES: 49 mg/dL (ref 0–149)
VLDL Cholesterol Cal: 10 mg/dL (ref 5–40)

## 2016-11-12 LAB — HEPATIC FUNCTION PANEL
ALK PHOS: 79 IU/L (ref 39–117)
ALT: 15 IU/L (ref 0–44)
AST: 20 IU/L (ref 0–40)
Albumin: 4 g/dL (ref 3.6–4.8)
BILIRUBIN, DIRECT: 0.25 mg/dL (ref 0.00–0.40)
Bilirubin Total: 1 mg/dL (ref 0.0–1.2)
Total Protein: 6.3 g/dL (ref 6.0–8.5)

## 2017-01-09 ENCOUNTER — Encounter: Payer: Self-pay | Admitting: Internal Medicine

## 2017-01-09 ENCOUNTER — Ambulatory Visit (INDEPENDENT_AMBULATORY_CARE_PROVIDER_SITE_OTHER): Payer: Medicare HMO | Admitting: Internal Medicine

## 2017-01-09 VITALS — BP 104/60 | HR 72 | Ht 73.0 in | Wt 186.1 lb

## 2017-01-09 DIAGNOSIS — I2510 Atherosclerotic heart disease of native coronary artery without angina pectoris: Secondary | ICD-10-CM

## 2017-01-09 DIAGNOSIS — I255 Ischemic cardiomyopathy: Secondary | ICD-10-CM | POA: Insufficient documentation

## 2017-01-09 DIAGNOSIS — I712 Thoracic aortic aneurysm, without rupture, unspecified: Secondary | ICD-10-CM | POA: Insufficient documentation

## 2017-01-09 DIAGNOSIS — E785 Hyperlipidemia, unspecified: Secondary | ICD-10-CM | POA: Diagnosis not present

## 2017-01-09 MED ORDER — TICAGRELOR 90 MG PO TABS: 90.0000 mg | ORAL_TABLET | Freq: Two times a day (BID) | ORAL | 3 refills | Status: DC

## 2017-01-09 MED ORDER — IRBESARTAN 150 MG PO TABS: 150.0000 mg | ORAL_TABLET | Freq: Every day | ORAL | 3 refills | Status: DC

## 2017-01-09 MED ORDER — METOPROLOL TARTRATE 25 MG PO TABS: 25.0000 mg | ORAL_TABLET | Freq: Two times a day (BID) | ORAL | 3 refills | Status: DC

## 2017-01-09 NOTE — Patient Instructions (Signed)
Medication Instructions:  Your physician recommends that you continue on your current medications as directed. Please refer to the Current Medication list given to you today.   Labwork: None ordered  Testing/Procedures: Your physician has requested that you have an echocardiogram in 6 months prior to follow up appointment. Echocardiography is a painless test that uses sound waves to create images of your heart. It provides your doctor with information about the size and shape of your heart and how well your heart's chambers and valves are working. This procedure takes approximately one hour. There are no restrictions for this procedure.    Follow-Up: Your physician wants you to follow-up in: 6 months with Dr. Okey DupreEnd. You will receive a reminder letter in the mail two months in advance. If you don't receive a letter, please call our office to schedule the follow-up appointment.   Any Other Special Instructions Will Be Listed Below (If Applicable).     If you need a refill on your cardiac medications before your next appointment, please call your pharmacy.

## 2017-01-09 NOTE — Progress Notes (Signed)
Follow-up Outpatient Visit Date: 01/09/2017  Primary Care Provider: Joette Catching, MD [address] [place] [address]  Chief Complaint: Follow-up coronary artery disease  HPI:  Patrick Weber is a 71 y.o. year-old male with history of coronary artery disease status post STEMI with thrombotic occlusion of distal RCA in 09/2016, who presents for follow-up. He was last seen in our office by Chelsea Aus, PA, on 10/01/16. At that time, he was doing well. Today, [name] reports that he is still doing well. He has not had any chest pain or shortness of breath since his STEMI. He remains active, walking 3-1/2 to 4 miles a day, 4 days a week. He is also back to work in [employer], which he is tolerating well. He has not had any edema, palpitations, lightheadedness, or claudication. He is tolerating his medications well, including dual antiplatelet therapy with aspirin and ticagrelor without significant bleeding.   --------------------------------------------------------------------------------------------------  Cardiovascular History & Procedures: Cardiovascular Problems:  Coronary artery disease status post inferior STEMI (09/2016)  Ischemic cardiomyopathy  Dilated aortic root  Risk Factors:  Known coronary artery disease, hyperlipidemia, male gender, and age > 57  Cath/PCI:  LHC/PCI (09/18/16): LMCA with 20% distal narrowing. LAD with long 60% proximal to mid vessel stenosis. Small D1 with 30% ostial. Small D2 with 60% ostial disease. Small ramus with 10-20% proximal disease. Small LCx without significant disease. Large, dominant and ectatic RCA with 40% ostial, 60% mid, and 100% thrombotic distal disease. Successful aspiration thrombectomy and PCI to mid and distal RCA with non-overlapping resolute on X4 0.0 x 22 mm (mid) and 4.0 x 26 mm (distal) drug-eluting stents. LVEDP 22 mmHg.  CV Surgery:  None  EP Procedures and Devices:  None  Non-Invasive Evaluation(s):  CTA chest  (10/08/16): Moderately dilated aortic root measuring 4.7 cm at the sinuses of Valsalva, 4.0 cm at the sinotubular junction, 3.9 cm the ascending aorta, and 2.9 cm at the arch.  TTE (09/19/16): Mildly dilated LV with mild LVH. LVEF 40-45% with inferior akinesis. Grade 1 diastolic dysfunction. Mild AI. Moderately dilated aortic root.  Recent CV Pertinent Labs: Lab Results  Component Value Date   CHOL 116 11/12/2016   HDL 42 11/12/2016   LDLCALC 64 11/12/2016   TRIG 49 11/12/2016   CHOLHDL 2.8 11/12/2016   CHOLHDL 4.4 09/19/2016   INR 1.24 09/18/2016   K 3.8 09/22/2016   MG 2.0 09/18/2016   BUN 15 09/22/2016   CREATININE 0.85 09/22/2016    Past medical and surgical history were reviewed and updated in EPIC.  Current Meds  Medication Sig  . aspirin EC 81 MG EC tablet Take 1 tablet (81 mg total) by mouth daily.  [name] atorvastatin (LIPITOR) 80 MG tablet Take 1 tablet (80 mg total) by mouth daily at 6 PM.  . irbesartan (AVAPRO) 150 MG tablet Take 1 tablet (150 mg total) by mouth daily.  . metoprolol tartrate (LOPRESSOR) 25 MG tablet Take 1 tablet (25 mg total) by mouth 2 (two) times daily.  . nitroGLYCERIN (NITROSTAT) 0.4 MG SL tablet Place 1 tablet (0.4 mg total) under the tongue every 5 (five) minutes x 3 doses as needed for chest pain.  . ticagrelor (BRILINTA) 90 MG TABS tablet Take 1 tablet (90 mg total) by mouth every 12 (twelve) hours.  . [DISCONTINUED] irbesartan (AVAPRO) 150 MG tablet Take 1 tablet (150 mg total) by mouth daily.  . [DISCONTINUED] metoprolol tartrate (LOPRESSOR) 25 MG tablet Take 1 tablet (25 mg total) by mouth 2 (two)  times daily.  . [DISCONTINUED] ticagrelor (BRILINTA) 90 MG TABS tablet Take 1 tablet (90 mg total) by mouth every 12 (twelve) hours.  . [DISCONTINUED] ticagrelor (BRILINTA) 90 MG TABS tablet Take 1 tablet (90 mg total) by mouth every 12 (twelve) hours.    Allergies: Lidocaine  Social History   Social History  . Marital status: Married    Spouse  name: N/A  . Number of children: N/A  . Years of education: N/A   Occupational History  . Not on file.   Social History Main Topics  . Smoking status: Never Smoker  . Smokeless tobacco: Never Used  . Alcohol use Not on file  . Drug use: No  . Sexual activity: Not on file   Other Topics Concern  . Not on file   Social History Narrative  . No narrative on file    Family History  Problem Relation Age of Onset  . Heart disease Mother   . Emphysema Father     Review of Systems: A 12-system review of systems was performed and was negative except as noted in the HPI.  --------------------------------------------------------------------------------------------------  Physical Exam: BP 104/60   Pulse 72   Ht 6\' 1"  (1.854 m)   Wt 186 lb 1.9 oz (84.4 kg)   SpO2 98%   BMI 24.56 kg/m    General:  Well-developed, well-nourished man, seated comfortably in the exam room. HEENT: No conjunctival pallor or scleral icterus. Moist mucous membranes.  OP clear. Neck: Supple without lymphadenopathy, thyromegaly, JVD, or HJR. Lungs: Normal work of breathing. Clear to auscultation bilaterally without wheezes or crackles. Heart: Regular rate and rhythm without murmurs, rubs, or gallops. Non-displaced PMI. Abd: Bowel sounds present. Soft, NT/ND without hepatosplenomegaly Ext: No lower extremity edema. Radial, PT, and DP pulses are 2+ bilaterally. Skin: Warm and dry without rash.  Lab Results  Component Value Date   WBC 6.1 09/22/2016   HGB 14.9 09/22/2016   HCT 42.8 09/22/2016   MCV 88.4 09/22/2016   PLT 194 09/22/2016    Lab Results  Component Value Date   NA 139 09/22/2016   K 3.8 09/22/2016   CL 106 09/22/2016   CO2 24 09/22/2016   BUN 15 09/22/2016   CREATININE 0.85 09/22/2016   GLUCOSE 91 09/22/2016   ALT 15 11/12/2016    Lab Results  Component Value Date   CHOL 116 11/12/2016   HDL 42 11/12/2016   LDLCALC 64 11/12/2016   TRIG 49 11/12/2016   CHOLHDL 2.8  11/12/2016    --------------------------------------------------------------------------------------------------  ASSESSMENT AND PLAN: Coronary artery disease without angina [name] is doing well without recurrent chest pain or other symptoms of suggest worsening coronary insufficiency. We will continue his current medication regimen, including dual antiplatelet therapy with aspirin and ticagrelor for at least 12 months from the time of his STEMI in May. He remains active and is not interested in participating in cardiac rehabilitation.  Ischemic cardiomyopathy [name] appears euvolemic and well compensated with NYHA class I symptoms. Given moderately reduced LVEF at the time of his STEMI in May, we will plan to repeat an echo before his follow-up visit with me in 6 months. We will continue current doses of metoprolol and irbesartan, as borderline low blood pressure precludes further up titration at this time.  Thoracic aortic aneurysm Moderately dilated aortic root measuring up to 4.7 cm noted by echo and CTA of the chest. We will repeat echo in 6 months and likely CTA chest next June. Continue aggressive  blood pressure and lipid control.  Hyperlipidemia LDL in July was at goal (<70). Continue atorvastatin 80 mg daily.  Follow-up: Return to clinic in 6 months.  [name], MD 01/09/2017 8:18 AM

## 2017-01-16 ENCOUNTER — Other Ambulatory Visit (HOSPITAL_COMMUNITY): Payer: Medicare HMO

## 2017-02-13 ENCOUNTER — Encounter (INDEPENDENT_AMBULATORY_CARE_PROVIDER_SITE_OTHER): Payer: Self-pay

## 2017-02-13 ENCOUNTER — Ambulatory Visit (HOSPITAL_COMMUNITY): Payer: Medicare HMO | Attending: Internal Medicine

## 2017-02-13 ENCOUNTER — Other Ambulatory Visit: Payer: Self-pay

## 2017-02-13 DIAGNOSIS — I255 Ischemic cardiomyopathy: Secondary | ICD-10-CM | POA: Insufficient documentation

## 2017-02-13 DIAGNOSIS — E785 Hyperlipidemia, unspecified: Secondary | ICD-10-CM | POA: Insufficient documentation

## 2017-02-13 DIAGNOSIS — I2510 Atherosclerotic heart disease of native coronary artery without angina pectoris: Secondary | ICD-10-CM | POA: Insufficient documentation

## 2017-02-13 DIAGNOSIS — I77819 Aortic ectasia, unspecified site: Secondary | ICD-10-CM | POA: Insufficient documentation

## 2017-02-13 DIAGNOSIS — I252 Old myocardial infarction: Secondary | ICD-10-CM | POA: Insufficient documentation

## 2017-02-13 DIAGNOSIS — I082 Rheumatic disorders of both aortic and tricuspid valves: Secondary | ICD-10-CM | POA: Insufficient documentation

## 2017-04-14 ENCOUNTER — Other Ambulatory Visit: Payer: Self-pay | Admitting: Cardiology

## 2017-04-16 NOTE — Telephone Encounter (Signed)
Pt's was started on Atorvastatin in the hospital. Pt's pharmacy requesting a refill. Would you like to refill this medication? Please advise

## 2017-04-16 NOTE — Telephone Encounter (Signed)
yes

## 2017-07-20 ENCOUNTER — Encounter: Payer: Self-pay | Admitting: Internal Medicine

## 2017-07-20 ENCOUNTER — Ambulatory Visit: Payer: Medicare HMO | Admitting: Internal Medicine

## 2017-07-20 ENCOUNTER — Encounter (INDEPENDENT_AMBULATORY_CARE_PROVIDER_SITE_OTHER): Payer: Self-pay

## 2017-07-20 VITALS — BP 118/84 | HR 70 | Ht 73.0 in | Wt 190.6 lb

## 2017-07-20 DIAGNOSIS — I2510 Atherosclerotic heart disease of native coronary artery without angina pectoris: Secondary | ICD-10-CM

## 2017-07-20 DIAGNOSIS — I255 Ischemic cardiomyopathy: Secondary | ICD-10-CM

## 2017-07-20 DIAGNOSIS — I712 Thoracic aortic aneurysm, without rupture, unspecified: Secondary | ICD-10-CM

## 2017-07-20 DIAGNOSIS — E785 Hyperlipidemia, unspecified: Secondary | ICD-10-CM | POA: Diagnosis not present

## 2017-07-20 MED ORDER — TICAGRELOR 60 MG PO TABS: 60.0000 mg | ORAL_TABLET | Freq: Two times a day (BID) | ORAL | 3 refills | Status: DC

## 2017-07-20 NOTE — Patient Instructions (Addendum)
Medication Instructions:  Brilinta decrease to 60 mg twice per day, when you finish present dose  -- If you need a refill on your cardiac medications before your next appointment, please call your pharmacy. --  Labwork: None ordered  Testing/Procedures:  Your physician has requested that you have cardiac CT. Cardiac computed tomography (CT) is a painless test that uses an x-ray machine to take clear, detailed pictures of your heart. For further information please visit [email or web address]. Please follow instruction sheet as given.  Please schedule in MAY/JUNE  Follow-Up: Your physician wants you to follow-up in: 6 months with Dr. Okey DupreEnd.    You will receive a reminder letter in the mail two months in advance. If you don't receive a letter, please call our office to schedule the follow-up appointment.  Thank you for choosing [facility]!!    Any Other Special Instructions Will Be Listed Below (If Applicable).  CT-scan of the chest Please arrive at the [facility] main entrance of [facility] at ______ AM (30-45 minutes prior to test start time)  [facility] [address] [place], [place] [address] [phone]  Proceed to the [facility] Radiology Department (First Floor).  Please follow these instructions carefully (unless otherwise directed):  Hold all erectile dysfunction medications at least 48 hours prior to test.  On the Night Before the Test: . Drink plenty of water. . Do not consume any caffeinated/decaffeinated beverages or chocolate 12 hours prior to your test. On the Day of the Test: . Drink plenty of water. Do not drink any water within one hour of the test. . Do not eat any food 4 hours prior to the test. . You may take your regular medications prior to the test. .  - Take 50 mg of lopressor (metoprolol) one hour before the test. After the Test: . Drink plenty of water. . After receiving IV contrast, you may experience a mild flushed  feeling. This is normal. . On occasion, you may experience a mild rash up to 24 hours after the test. This is not dangerous. If this occurs, you can take Benadryl 25 mg and increase your fluid intake. . If you experience trouble breathing, this can be serious. If it is severe call 911 IMMEDIATELY. If it is mild, please call our office. . If you take any of these medications: Glipizide/Metformin, Avandament, Glucavance, please do not take 48 hours after completing test.

## 2017-07-20 NOTE — Progress Notes (Signed)
Follow-up Outpatient Visit Date: 07/20/2017  Primary Care Provider: Joette Catching, MD [address] [place] [address]  Chief Complaint: Follow-up coronary artery disease and thoracic aortic aneurysm  HPI:  [name] is a 72 y.o. year-old male with history of coronary artery disease status post STEMI with thrombotic occlusion of distal RCA in 09/2016, who presents for follow-up of CAD. I last saw him in May, at which time he was doing well.  Today, [name] reports that he is still doing very well.  He denies chest pain, shortness of breath, palpitations, lightheadedness, and edema.  He remains compliant with his medications and has not noticed any significant bleeding or bruising with aspirin and ticagrelor.  He is walking daily on his treadmill, typically 2.5 miles over the course of an hour.  --------------------------------------------------------------------------------------------------  Cardiovascular History & Procedures: Cardiovascular Problems:  Coronary artery disease status post inferior STEMI (09/2016)  Ischemic cardiomyopathy  Dilated aortic root  Risk Factors:  Known coronary artery disease, hyperlipidemia, male gender, and age > 68  Cath/PCI:  LHC/PCI (09/18/16): LMCA with 20% distal narrowing. LAD with long 60% proximal to mid vessel stenosis. Small D1 with 30% ostial. Small D2 with 60% ostial disease. Small ramus with 10-20% proximal disease. Small LCx without significant disease. Large, dominant and ectatic RCA with 40% ostial, 60% mid, and 100% thrombotic distal disease. Successful aspiration thrombectomy and PCI to mid and distal RCA with non-overlapping Resolute Onyx 4.0 x 22 mm (mid) and 4.0 x 26 mm (distal) drug-eluting stents. LVEDP 22 mmHg.  CV Surgery:  None  EP Procedures and Devices:  None  Non-Invasive Evaluation(s):  TTE (02/13/17): Normal LV size with mild LVH.  LVEF 45-50% with inferior hypokinesis.  Aortic sclerosis with mild AI.   Mildly thickened mitral valve with trivial MR.  Mild left atrial enlargement.  Mild TR.  Mild pulmonary hypertension (RVSP 39 mmHg).  Normal RV size and function.  CTA chest (10/08/16): Moderately dilated aortic root measuring 4.7 cm at the sinuses of Valsalva, 4.0 cm at the sinotubular junction, 3.9 cm the ascending aorta, and 2.9 cm at the arch.  TTE (09/19/16): Mildly dilated LV with mild LVH. LVEF 40-45% with inferior akinesis. Grade 1 diastolic dysfunction. Mild AI. Moderately dilated aortic root.  Recent CV Pertinent Labs: Lab Results  Component Value Date   CHOL 116 11/12/2016   HDL 42 11/12/2016   LDLCALC 64 11/12/2016   TRIG 49 11/12/2016   CHOLHDL 2.8 11/12/2016   CHOLHDL 4.4 09/19/2016   INR 1.24 09/18/2016   K 3.8 09/22/2016   MG 2.0 09/18/2016   BUN 15 09/22/2016   CREATININE 0.85 09/22/2016    Past medical and surgical history were reviewed and updated in EPIC.  Current Meds  Medication Sig  . aspirin EC 81 MG EC tablet Take 1 tablet (81 mg total) by mouth daily.  [name] atorvastatin (LIPITOR) 80 MG tablet TAKE 1 TABLET (80 MG TOTAL) BY MOUTH DAILY AT 6 PM.  . irbesartan (AVAPRO) 150 MG tablet Take 1 tablet (150 mg total) by mouth daily.  . metoprolol tartrate (LOPRESSOR) 25 MG tablet Take 1 tablet (25 mg total) by mouth 2 (two) times daily.  . nitroGLYCERIN (NITROSTAT) 0.4 MG SL tablet Place 1 tablet (0.4 mg total) under the tongue every 5 (five) minutes x 3 doses as needed for chest pain.  . ticagrelor (BRILINTA) 90 MG TABS tablet Take 1 tablet (90 mg total) by mouth every 12 (twelve) hours.    Allergies: Lidocaine  Social History   Socioeconomic History  . Marital status: Married    Spouse name: Not on file  . Number of children: Not on file  . Years of education: Not on file  . Highest education level: Not on file  Social Needs  . Financial resource strain: Not on file  . Food insecurity - worry: Not on file  . Food insecurity - inability: Not on file  .  Transportation needs - medical: Not on file  . Transportation needs - non-medical: Not on file  Occupational History  . Not on file  Tobacco Use  . Smoking status: Never Smoker  . Smokeless tobacco: Never Used  Substance and Sexual Activity  . Alcohol use: Not on file  . Drug use: No  . Sexual activity: Not on file  Other Topics Concern  . Not on file  Social History Narrative  . Not on file    Family History  Problem Relation Age of Onset  . Heart disease Mother   . Emphysema Father     Review of Systems: A 12-system review of systems was performed and was negative except as noted in the HPI.  --------------------------------------------------------------------------------------------------  Physical Exam: BP 118/84   Pulse 70   Ht 6\' 1"  (1.854 m)   Wt 190 lb 9.6 oz (86.5 kg)   BMI 25.15 kg/m   General: No acute distress. HEENT: No conjunctival pallor or scleral icterus. Moist mucous membranes.  OP clear. Neck: Supple without lymphadenopathy, thyromegaly, JVD, or HJR.. Lungs: Normal work of breathing. Clear to auscultation bilaterally without wheezes or crackles. Heart: Regular rate and rhythm without murmurs, rubs, or gallops. Non-displaced PMI. Abd: Bowel sounds present. Soft, NT/ND without hepatosplenomegaly Ext: No lower extremity edema. Radial, PT, and DP pulses are 2+ bilaterally. Skin: Warm and dry without rash.  EKG:  NSR with non-specific T-wave changes in the inferior leads. Compared with prior tracing from 10/01/16, inferior T-wave inversions are less pronounced. PVC's are no longer present.  Lab Results  Component Value Date   WBC 6.1 09/22/2016   HGB 14.9 09/22/2016   HCT 42.8 09/22/2016   MCV 88.4 09/22/2016   PLT 194 09/22/2016    Lab Results  Component Value Date   NA 139 09/22/2016   K 3.8 09/22/2016   CL 106 09/22/2016   CO2 24 09/22/2016   BUN 15 09/22/2016   CREATININE 0.85 09/22/2016   GLUCOSE 91 09/22/2016   ALT 15 11/12/2016     Lab Results  Component Value Date   CHOL 116 11/12/2016   HDL 42 11/12/2016   LDLCALC 64 11/12/2016   TRIG 49 11/12/2016   CHOLHDL 2.8 11/12/2016    --------------------------------------------------------------------------------------------------  ASSESSMENT AND PLAN: Coronary artery disease without angina [name] has recovered well from his inferior STEMI last year with 2 drug-eluting stents placed to the mid and distal RCA.  He will have completed 1 year of DAPT in May.  We discussed the risks and benefits of continuing indefinite dual antiplatelet therapy.  Given that he has 2 stents in a large, ectatic RCA as well as moderate disease in the left coronary artery, we will continue with long-term DAPT.  We will plan to decrease ticagrelor to 60 mg twice daily in mid May.  Ischemic cardiomyopathy Follow-up echo in 02/2017 was notable for mildly reduced LVEF of 45-50%.  [name] appears euvolemic and well compensated.  No medication changes today.  Thoracic aortic aneurysm Moderate enlargement of the a sending aorta noted by echo  and CT in the past.  Blood pressure and lipids are well controlled.  We will continue current medications and repeat a CTA of the chest in May or June to assess stability of the thoracic aorta.  Hyperlipidemia Most recent LDL in 11/2016 was at goal (less than 70).  Continue atorvastatin 80 mg daily.  Follow-up: Return to clinic in 6 months.  [name], MD 07/20/2017 8:16 AM

## 2017-10-01 ENCOUNTER — Encounter (INDEPENDENT_AMBULATORY_CARE_PROVIDER_SITE_OTHER): Payer: Self-pay

## 2017-10-01 ENCOUNTER — Other Ambulatory Visit: Payer: Medicare HMO | Admitting: *Deleted

## 2017-10-01 DIAGNOSIS — I712 Thoracic aortic aneurysm, without rupture, unspecified: Secondary | ICD-10-CM

## 2017-10-01 DIAGNOSIS — I2510 Atherosclerotic heart disease of native coronary artery without angina pectoris: Secondary | ICD-10-CM

## 2017-10-01 LAB — BASIC METABOLIC PANEL
BUN/Creatinine Ratio: 16 (ref 10–24)
BUN: 15 mg/dL (ref 8–27)
CO2: 22 mmol/L (ref 20–29)
CREATININE: 0.95 mg/dL (ref 0.76–1.27)
Calcium: 8.8 mg/dL (ref 8.6–10.2)
Chloride: 104 mmol/L (ref 96–106)
GFR calc Af Amer: 93 mL/min/{1.73_m2} (ref >59)
GFR calc non Af Amer: 81 mL/min/{1.73_m2} (ref >59)
GLUCOSE: 87 mg/dL (ref 65–99)
Potassium: 3.8 mmol/L (ref 3.5–5.2)
SODIUM: 141 mmol/L (ref 134–144)

## 2017-10-08 ENCOUNTER — Other Ambulatory Visit: Payer: Medicare HMO

## 2017-10-16 ENCOUNTER — Ambulatory Visit (INDEPENDENT_AMBULATORY_CARE_PROVIDER_SITE_OTHER)
Admission: RE | Admit: 2017-10-16 | Discharge: 2017-10-16 | Disposition: A | Discharge status: Home / Self Care | Payer: Medicare HMO | Source: Ambulatory Visit | Attending: Internal Medicine | Admitting: Internal Medicine

## 2017-10-16 DIAGNOSIS — I712 Thoracic aortic aneurysm, without rupture, unspecified: Secondary | ICD-10-CM

## 2017-10-16 MED ORDER — IOPAMIDOL (ISOVUE-370) INJECTION 76%
100.0000 mL | Freq: Once | INTRAVENOUS | Status: AC | PRN
  Administered 2017-10-16: 100 mL via INTRAVENOUS

## 2017-12-01 ENCOUNTER — Other Ambulatory Visit: Payer: Self-pay | Admitting: Internal Medicine

## 2017-12-01 NOTE — Telephone Encounter (Signed)
Please review for refill, Thanks !  

## 2017-12-04 ENCOUNTER — Other Ambulatory Visit: Payer: Self-pay | Admitting: Internal Medicine

## 2017-12-04 MED ORDER — METOPROLOL TARTRATE 25 MG PO TABS: 25.0000 mg | ORAL_TABLET | Freq: Two times a day (BID) | ORAL | 2 refills | Status: DC

## 2017-12-04 MED ORDER — IRBESARTAN 150 MG PO TABS: 150.0000 mg | ORAL_TABLET | Freq: Every day | ORAL | 2 refills | Status: DC

## 2017-12-07 ENCOUNTER — Other Ambulatory Visit: Payer: Self-pay

## 2017-12-07 MED ORDER — ATORVASTATIN CALCIUM 80 MG PO TABS: 80.0000 mg | ORAL_TABLET | Freq: Every day | ORAL | 9 refills | Status: DC

## 2017-12-09 ENCOUNTER — Other Ambulatory Visit: Payer: Self-pay

## 2017-12-09 ENCOUNTER — Telehealth: Payer: Self-pay | Admitting: Internal Medicine

## 2017-12-09 MED ORDER — LOSARTAN POTASSIUM 50 MG PO TABS: 50.0000 mg | ORAL_TABLET | Freq: Every day | ORAL | 3 refills | Status: DC

## 2017-12-09 NOTE — Telephone Encounter (Signed)
lpmtcb 8/7 

## 2017-12-09 NOTE — Telephone Encounter (Signed)
We can switch him to losartan 50 mg daily, if they have that in stock.  Thanks.  Yvonne Kendallhristopher Tejuan Gholson, MD [facility] Pager: [phone]

## 2017-12-09 NOTE — Telephone Encounter (Signed)
[facility] [place] replacement medication for Irbesartan 150 mg tablet, this medication is on back order, pt can not get this medication. Please address

## 2017-12-09 NOTE — Telephone Encounter (Signed)
Informed patient of Dr Serita KyleEnd's recommendation to start Losartan 50 mg daily.  He verbalized understanding.

## 2017-12-15 NOTE — Telephone Encounter (Signed)
Please review for refill, thanks ! 

## 2018-01-31 NOTE — Progress Notes (Signed)
Follow-up Outpatient Visit Date: 02/01/2018  Primary Care Provider: Joette Catching, MD [address] [place] [address]  Chief Complaint: Follow-up coronary artery disease and thoracic aortic aneurysm  HPI:  Patrick Weber is a 73 y.o. year-old male with history of coronary artery disease status post STEMI with thrombotic occlusion of distal RCA in 09/2016, ischemic cardiomyopathy, and thoracic aortic aneurysm, who presents for follow-up of CAD.  I last saw Patrick Weber in March, at which time he was doing well.  We agreed to continue long-term DAPT and opted to decrease ticagrelor to 60 mg BID in May.  Today, Patrick Weber reports that he has continued to do well.  He denies chest pain, shortness of breath, palpitations, lightheadedness, and edema.  Home blood pressures are typically 120-130/65-75.  He is tolerating his medications well.  He has not had any significant bleeding with ticagrelor.  --------------------------------------------------------------------------------------------------  Cardiovascular History & Procedures: Cardiovascular Problems:  Coronary artery disease status post inferior STEMI (09/2016)  Ischemic cardiomyopathy  Dilated aortic root  Risk Factors:  Known coronary artery disease, hyperlipidemia, male gender, and age > 44  Cath/PCI:  LHC/PCI (09/18/16):LMCA with 20% distal narrowing. LAD with long 60% proximal to mid vessel stenosis. Small D1 with 30% ostial. Small D2 with 60% ostial disease. Small ramus with 10-20% proximal disease. Small LCx without significant disease. Large, dominant and ectatic RCA with 40% ostial, 60% mid, and 100% thrombotic distal disease. Successful aspiration thrombectomy and PCI to mid and distal RCA with non-overlapping Resolute Onyx 4.0 x 22 mm (mid) and 4.0 x 26 mm (distal) drug-eluting stents. LVEDP 22 mmHg.  CV Surgery:  None  EP Procedures and Devices:  None  Non-Invasive Evaluation(s):  CTA chest (10/16/17): Stable  moderate dilation of the aortic root measuring 4.7 cm at the sinuses of Valsalva and 4.0 cm at the proximal ascending aorta.  No significant change since 02/2017.  TTE (02/13/17): Normal LV size with mild LVH.  LVEF 45-50% with inferior hypokinesis.  Aortic sclerosis with mild AI.  Mildly thickened mitral valve with trivial MR.  Mild left atrial enlargement.  Mild TR.  Mild pulmonary hypertension (RVSP 39 mmHg).  Normal RV size and function.  CTA chest (10/08/16): Moderately dilated aortic root measuring 4.7 cm at the sinuses of Valsalva, 4.0 cm at the sinotubular junction, 3.9 cm the ascending aorta, and 2.9 cm at the arch.  TTE (09/19/16): Mildly dilated LV with mild LVH. LVEF 40-45% with inferior akinesis. Grade 1 diastolic dysfunction. Mild AI. Moderately dilated aortic root.  Recent CV Pertinent Labs: Lab Results  Component Value Date   CHOL 116 11/12/2016   HDL 42 11/12/2016   LDLCALC 64 11/12/2016   TRIG 49 11/12/2016   CHOLHDL 2.8 11/12/2016   CHOLHDL 4.4 09/19/2016   INR 1.24 09/18/2016   K 3.8 10/01/2017   MG 2.0 09/18/2016   BUN 15 10/01/2017   CREATININE 0.95 10/01/2017    Past medical and surgical history were reviewed and updated in EPIC.  Current Meds  Medication Sig  . aspirin EC 81 MG EC tablet Take 1 tablet (81 mg total) by mouth daily.  [name] atorvastatin (LIPITOR) 80 MG tablet Take 1 tablet (80 mg total) by mouth daily.  [name] BRILINTA 60 MG TABS tablet TAKE 1 TABLET (60 MG TOTAL) BY MOUTH 2 (TWO) TIMES DAILY.  [name] losartan (COZAAR) 50 MG tablet Take 1 tablet (50 mg total) by mouth daily.  . metoprolol tartrate (LOPRESSOR) 25 MG tablet Take 1 tablet (25 mg total)  by mouth 2 (two) times daily.  . nitroGLYCERIN (NITROSTAT) 0.4 MG SL tablet Place 1 tablet (0.4 mg total) under the tongue every 5 (five) minutes x 3 doses as needed for chest pain.    Allergies: Lidocaine  Social History   Tobacco Use  . Smoking status: Never Smoker  . Smokeless tobacco: Never Used    Substance Use Topics  . Alcohol use: Not on file  . Drug use: No    Family History  Problem Relation Age of Onset  . Heart disease Mother   . Emphysema Father     Review of Systems: A 12-system review of systems was performed and was negative except as noted in the HPI.  --------------------------------------------------------------------------------------------------  Physical Exam: BP 138/78   Pulse 63   Ht 6\' 1"  (1.854 m)   Wt 189 lb 12.8 oz (86.1 kg)   BMI 25.04 kg/m   General: NAD. HEENT: No conjunctival pallor or scleral icterus. Moist mucous membranes.  OP clear. Neck: Supple without lymphadenopathy, thyromegaly, JVD, or HJR. Lungs: Normal work of breathing. Clear to auscultation bilaterally without wheezes or crackles. Heart: Regular rate and rhythm without murmurs, rubs, or gallops. Non-displaced PMI. Abd: Bowel sounds present. Soft, NT/ND without hepatosplenomegaly Ext: No lower extremity edema. Skin: Warm and dry without rash.  EKG: Normal sinus rhythm with single PVC.  Otherwise, no significant abnormalities.  Lab Results  Component Value Date   WBC 6.1 09/22/2016   HGB 14.9 09/22/2016   HCT 42.8 09/22/2016   MCV 88.4 09/22/2016   PLT 194 09/22/2016    Lab Results  Component Value Date   NA 141 10/01/2017   K 3.8 10/01/2017   CL 104 10/01/2017   CO2 22 10/01/2017   BUN 15 10/01/2017   CREATININE 0.95 10/01/2017   GLUCOSE 87 10/01/2017   ALT 15 11/12/2016    Lab Results  Component Value Date   CHOL 116 11/12/2016   HDL 42 11/12/2016   LDLCALC 64 11/12/2016   TRIG 49 11/12/2016   CHOLHDL 2.8 11/12/2016    --------------------------------------------------------------------------------------------------  ASSESSMENT AND PLAN: Coronary artery disease without angina Patrick Weber continues to do well.  No recurrent angina.  He is tolerating long-term DAPT well, which we will continue with given his multivessel CAD.  I will check a CBC  today.  Thoracic aortic aneurysm Dilated aortic root stable on CTA in June.  We will plan to repeat a CTA in 07/2018 to ensure that this has not enlarged.  Continue statin therapy and blood pressure control with current agents.  Hypertension Blood pressure upper normal today but typically better at home.  Continue current doses of metoprolol and losartan.  I will check a complete metabolic panel today.  Hyperlipidemia LDL at goal when last checked a year ago.  I will repeat a CMP and lipid panel today.  Follow-up: Return to see me in [place] office in early 08/2018.  [name], MD 02/01/2018 8:21 AM

## 2018-02-01 ENCOUNTER — Ambulatory Visit: Payer: Medicare HMO | Admitting: Internal Medicine

## 2018-02-01 ENCOUNTER — Encounter (INDEPENDENT_AMBULATORY_CARE_PROVIDER_SITE_OTHER): Payer: Self-pay

## 2018-02-01 ENCOUNTER — Encounter: Payer: Self-pay | Admitting: Internal Medicine

## 2018-02-01 VITALS — BP 138/78 | HR 63 | Ht 73.0 in | Wt 189.8 lb

## 2018-02-01 DIAGNOSIS — E785 Hyperlipidemia, unspecified: Secondary | ICD-10-CM | POA: Diagnosis not present

## 2018-02-01 DIAGNOSIS — I712 Thoracic aortic aneurysm, without rupture, unspecified: Secondary | ICD-10-CM

## 2018-02-01 DIAGNOSIS — I10 Essential (primary) hypertension: Secondary | ICD-10-CM | POA: Diagnosis not present

## 2018-02-01 DIAGNOSIS — I2510 Atherosclerotic heart disease of native coronary artery without angina pectoris: Secondary | ICD-10-CM | POA: Diagnosis not present

## 2018-02-01 LAB — COMPREHENSIVE METABOLIC PANEL
A/G RATIO: 1.7 (ref 1.2–2.2)
ALT: 12 IU/L (ref 0–44)
AST: 19 IU/L (ref 0–40)
Albumin: 4.2 g/dL (ref 3.5–4.8)
Alkaline Phosphatase: 77 IU/L (ref 39–117)
BUN/Creatinine Ratio: 18 (ref 10–24)
BUN: 16 mg/dL (ref 8–27)
Bilirubin Total: 0.7 mg/dL (ref 0.0–1.2)
CALCIUM: 9 mg/dL (ref 8.6–10.2)
CHLORIDE: 102 mmol/L (ref 96–106)
CO2: 24 mmol/L (ref 20–29)
Creatinine, Ser: 0.9 mg/dL (ref 0.76–1.27)
GFR calc Af Amer: 100 mL/min/{1.73_m2} (ref >59)
GFR, EST NON AFRICAN AMERICAN: 86 mL/min/{1.73_m2} (ref >59)
Globulin, Total: 2.5 g/dL (ref 1.5–4.5)
Glucose: 91 mg/dL (ref 65–99)
POTASSIUM: 4.3 mmol/L (ref 3.5–5.2)
Sodium: 139 mmol/L (ref 134–144)
Total Protein: 6.7 g/dL (ref 6.0–8.5)

## 2018-02-01 LAB — LIPID PANEL
CHOL/HDL RATIO: 3.2 ratio (ref 0.0–5.0)
CHOLESTEROL TOTAL: 139 mg/dL (ref 100–199)
HDL: 43 mg/dL (ref >39)
LDL Calculated: 86 mg/dL (ref 0–99)
TRIGLYCERIDES: 49 mg/dL (ref 0–149)
VLDL Cholesterol Cal: 10 mg/dL (ref 5–40)

## 2018-02-01 LAB — CBC
Hematocrit: 42.1 % (ref 37.5–51.0)
Hemoglobin: 15 g/dL (ref 13.0–17.7)
MCH: 31.4 pg (ref 26.6–33.0)
MCHC: 35.6 g/dL (ref 31.5–35.7)
MCV: 88 fL (ref 79–97)
PLATELETS: 191 10*3/uL (ref 150–450)
RBC: 4.77 x10E6/uL (ref 4.14–5.80)
RDW: 13.1 % (ref 12.3–15.4)
WBC: 4.9 10*3/uL (ref 3.4–10.8)

## 2018-02-01 NOTE — Patient Instructions (Addendum)
Medication Instructions:  Your physician recommends that you continue on your current medications as directed. Please refer to the Current Medication list given to you today.  -- If you need a refill on your cardiac medications before your next appointment, please call your pharmacy. --  Labwork:TODAY CBC CMP LIPID  Testing/Procedures: TO BE DONE IN [facility]  Your physician has requested that you have CHEST CT. Cardiac computed tomography (CT) is a painless test that uses an x-ray machine to take clear, detailed pictures of your heart. For further information please visit [email or web address] Please follow instruction sheet as given.    Follow-Up: Your physician wants you to follow-up in early April with Dr. Okey Dupre in [place].   You will receive a reminder letter in the mail two months in advance. If you don't receive a letter, please call our office to schedule the follow-up appointment.  Thank you for choosing [facility]!!    Any Other Special Instructions Will Be Listed Below (If Applicable).

## 2018-02-03 ENCOUNTER — Telehealth: Payer: Self-pay

## 2018-02-03 DIAGNOSIS — E785 Hyperlipidemia, unspecified: Secondary | ICD-10-CM

## 2018-02-03 MED ORDER — EZETIMIBE 10 MG PO TABS: 10.0000 mg | ORAL_TABLET | Freq: Every day | ORAL | 3 refills | Status: DC

## 2018-02-03 NOTE — Telephone Encounter (Signed)
-----   Message from [name], MD sent at 02/02/2018  6:57 AM EDT ----- Please let Patrick Weber know that his LDL has increased since last year and is now above goal.  Given that he is already on the max dose of atorvastatin, I recommend that we add ezetimibe 10 mg daily and repeat a fasting lipid panel and ALT in ~3 months.  If his LDL is still above goal at that time (goal < 70), we will need to consider referral to the lipid clinic.  Kidney function, liver function, and electrolytes are normal.

## 2018-02-03 NOTE — Telephone Encounter (Signed)
-----   Message from Christopher End, MD sent at 02/02/2018  6:57 AM EDT ----- Please let Mr. Sharpe know that his LDL has increased since last year and is now above goal.  Given that he is already on the max dose of atorvastatin, I recommend that we add ezetimibe 10 mg daily and repeat a fasting lipid panel and ALT in ~3 months.  If his LDL is still above goal at that time (goal < 70), we will need to consider referral to the lipid clinic.  Kidney function, liver function, and electrolytes are normal. 

## 2018-02-03 NOTE — Telephone Encounter (Signed)
Notes recorded by Sigurd Sos, RN on 02/03/2018 at 8:08 AM EDT lpmtcb 10/2 ------

## 2018-02-03 NOTE — Telephone Encounter (Signed)
Notes recorded by Sigurd Sos, RN on 02/03/2018 at 3:22 PM EDT Informed patient of lab results/recommendations. He verbalized understanding and will start Zetia 10 mg daily and come in for labs 1/6.

## 2018-05-10 ENCOUNTER — Other Ambulatory Visit: Payer: Medicare HMO | Admitting: *Deleted

## 2018-05-10 ENCOUNTER — Encounter (INDEPENDENT_AMBULATORY_CARE_PROVIDER_SITE_OTHER): Payer: Self-pay

## 2018-05-10 DIAGNOSIS — E785 Hyperlipidemia, unspecified: Secondary | ICD-10-CM

## 2018-05-10 LAB — ALT: ALT: 36 IU/L (ref 0–44)

## 2018-05-10 LAB — LIPID PANEL
CHOLESTEROL TOTAL: 124 mg/dL (ref 100–199)
Chol/HDL Ratio: 2.8 ratio (ref 0.0–5.0)
HDL: 44 mg/dL (ref >39)
LDL CALC: 64 mg/dL (ref 0–99)
TRIGLYCERIDES: 81 mg/dL (ref 0–149)
VLDL CHOLESTEROL CAL: 16 mg/dL (ref 5–40)

## 2018-05-12 ENCOUNTER — Telehealth: Payer: Self-pay

## 2018-05-12 NOTE — Telephone Encounter (Signed)
Notes recorded by Sigurd Sos, RN on 05/12/2018 at 8:36 AM EST The patient has been notified of the result and verbalized understanding. All questions (if any) were answered. Sigurd Sos, RN 05/12/2018 8:36 AM

## 2018-05-12 NOTE — Telephone Encounter (Signed)
-----   Message from [name], MD sent at 05/11/2018  2:24 PM EST ----- Please let Mr. Treichler know that his lipids are well controlled with LDL at goal (less than 70).  His ALT is also normal.  He should continue his current medications and follow-up as previously arranged.

## 2018-05-15 IMAGING — CR DG CHEST 1V PORT
1 series · 1 of 1 positions shown · non-contrast
Comparison: None.

CLINICAL DATA: ST elevation myocardial infarction.

EXAM:
PORTABLE CHEST 1 VIEW

[AP]
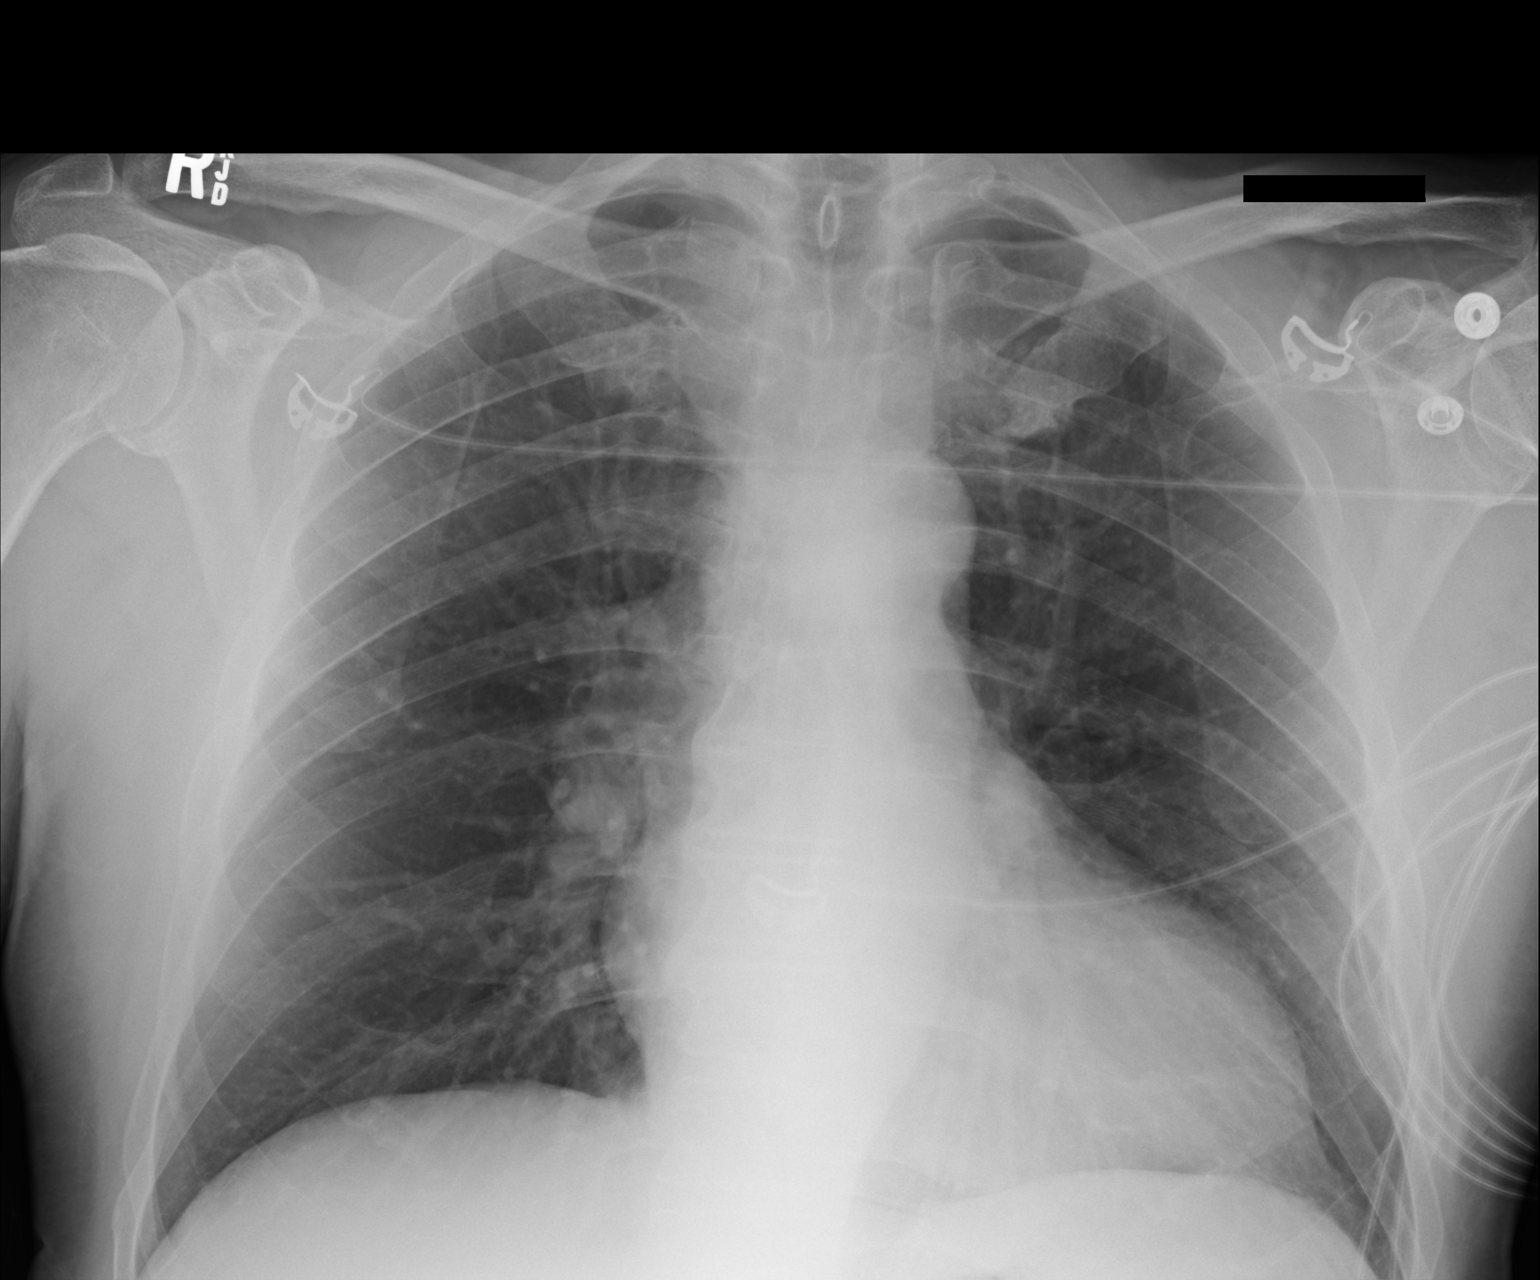

[1 of 1 positions shown; findings below may reference images not displayed]

FINDINGS: The heart size and mediastinal contours are within normal limits.
Both lungs are clear. The visualized skeletal structures are
unremarkable.
IMPRESSION: No active disease.

## 2018-05-18 ENCOUNTER — Other Ambulatory Visit: Payer: Self-pay

## 2018-05-18 MED ORDER — LOSARTAN POTASSIUM 50 MG PO TABS: 50.0000 mg | ORAL_TABLET | Freq: Every day | ORAL | 3 refills | Status: DC

## 2018-05-19 ENCOUNTER — Telehealth: Payer: Self-pay | Admitting: Internal Medicine

## 2018-05-19 ENCOUNTER — Other Ambulatory Visit: Payer: Self-pay

## 2018-05-19 NOTE — Telephone Encounter (Signed)
[facility]artan [facility] today. They are now able to fill the prescription. They will fill and let the patient know.

## 2018-05-19 NOTE — Telephone Encounter (Signed)
pateint would like Losartan refilled, but states [facility] [place] does not [facility] the 50 mg tablet. Please call and advise different doseage

## 2018-05-19 NOTE — Telephone Encounter (Signed)
This encounter was created in error - please disregard.

## 2018-08-02 ENCOUNTER — Other Ambulatory Visit: Payer: Medicare HMO

## 2018-09-17 ENCOUNTER — Other Ambulatory Visit: Payer: Self-pay | Admitting: *Deleted

## 2018-09-17 DIAGNOSIS — I10 Essential (primary) hypertension: Secondary | ICD-10-CM

## 2018-09-17 DIAGNOSIS — I2510 Atherosclerotic heart disease of native coronary artery without angina pectoris: Secondary | ICD-10-CM

## 2018-09-22 ENCOUNTER — Other Ambulatory Visit: Payer: Self-pay

## 2018-09-22 ENCOUNTER — Encounter (INDEPENDENT_AMBULATORY_CARE_PROVIDER_SITE_OTHER): Payer: Self-pay

## 2018-09-22 ENCOUNTER — Other Ambulatory Visit: Payer: Medicare HMO | Admitting: *Deleted

## 2018-09-22 DIAGNOSIS — I2510 Atherosclerotic heart disease of native coronary artery without angina pectoris: Secondary | ICD-10-CM

## 2018-09-22 DIAGNOSIS — I10 Essential (primary) hypertension: Secondary | ICD-10-CM

## 2018-09-22 LAB — BASIC METABOLIC PANEL
BUN/Creatinine Ratio: 17 (ref 10–24)
BUN: 16 mg/dL (ref 8–27)
CO2: 23 mmol/L (ref 20–29)
Calcium: 9 mg/dL (ref 8.6–10.2)
Chloride: 105 mmol/L (ref 96–106)
Creatinine, Ser: 0.95 mg/dL (ref 0.76–1.27)
GFR calc Af Amer: 93 mL/min/{1.73_m2} (ref >59)
GFR calc non Af Amer: 80 mL/min/{1.73_m2} (ref >59)
Glucose: 89 mg/dL (ref 65–99)
Potassium: 4.2 mmol/L (ref 3.5–5.2)
Sodium: 142 mmol/L (ref 134–144)

## 2018-10-01 ENCOUNTER — Other Ambulatory Visit: Payer: Self-pay

## 2018-10-01 ENCOUNTER — Ambulatory Visit (INDEPENDENT_AMBULATORY_CARE_PROVIDER_SITE_OTHER)
Admission: RE | Admit: 2018-10-01 | Discharge: 2018-10-01 | Disposition: A | Discharge status: Home / Self Care | Payer: Medicare HMO | Source: Ambulatory Visit | Attending: Internal Medicine | Admitting: Internal Medicine

## 2018-10-01 DIAGNOSIS — I712 Thoracic aortic aneurysm, without rupture, unspecified: Secondary | ICD-10-CM

## 2018-10-01 DIAGNOSIS — I2510 Atherosclerotic heart disease of native coronary artery without angina pectoris: Secondary | ICD-10-CM

## 2018-10-01 DIAGNOSIS — E785 Hyperlipidemia, unspecified: Secondary | ICD-10-CM

## 2018-10-01 MED ORDER — IOHEXOL 350 MG/ML SOLN
100.0000 mL | Freq: Once | INTRAVENOUS | Status: AC | PRN
  Administered 2018-10-01: 100 mL via INTRAVENOUS

## 2018-10-13 ENCOUNTER — Telehealth: Payer: Self-pay | Admitting: *Deleted

## 2018-10-13 NOTE — Telephone Encounter (Signed)
Virtual Visit Pre-Appointment Phone Call  "(Name), I am calling you today to discuss your upcoming appointment. We are currently trying to limit exposure to the virus that causes COVID-19 by seeing patients at home rather than in the office."  1. "What is the BEST phone number to call the day of the visit?" - include this in appointment notes  2. "Do you have or have access to (through a family member/friend) a smartphone with video capability that we can use for your visit?" a. If yes - list this number in appt notes as "cell" (if different from BEST phone #) and list the appointment type as a VIDEO visit in appointment notes b. If no - list the appointment type as a PHONE visit in appointment notes  3. Confirm consent - "In the setting of the current Covid19 crisis, you are scheduled for a (Telephone) visit with your provider on (10/14/2018) at (11:00 am).  Just as we do with many in-office visits, in order for you to participate in this visit, we must obtain consent.  If you'd like, I can send this to your mychart (if signed up) or email for you to review.  Otherwise, I can obtain your verbal consent now.  All virtual visits are billed to your insurance company just like a normal visit would be.  By agreeing to a virtual visit, we'd like you to understand that the technology does not allow for your provider to perform an examination, and thus may limit your provider's ability to fully assess your condition. If your provider identifies any concerns that need to be evaluated in person, we will make arrangements to do so.  Finally, though the technology is pretty good, we cannot assure that it will always work on either your or our end, and in the setting of a video visit, we may have to convert it to a phone-only visit.  In either situation, we cannot ensure that we have a secure connection.  Are you willing to proceed?" Yes  4. Advise patient to be prepared - "Two hours prior to your appointment,  go ahead and check your blood pressure, pulse, oxygen saturation, and your weight (if you have the equipment to check those) and write them all down. When your visit starts, your provider will ask you for this information. If you have an Apple Watch or Kardia device, please plan to have heart rate information ready on the day of your appointment. Please have a pen and paper handy nearby the day of the visit as well."  5. Give patient instructions for MyChart download to smartphone OR Doximity/Doxy.me as below if video visit (depending on what platform provider is using)  6. Inform patient they will receive a phone call 15 minutes prior to their appointment time (may be from unknown caller ID) so they should be prepared to answer    TELEPHONE CALL NOTE  TERRY BOLOTIN has been deemed a candidate for a follow-up tele-health visit to limit community exposure during the Covid-19 pandemic. I spoke with the patient via phone to ensure availability of phone/video source, confirm preferred email & phone number, and discuss instructions and expectations.  I reminded CORRIGAN KRETSCHMER to be prepared with any vital sign and/or heart rhythm information that could potentially be obtained via home monitoring, at the time of his visit. I reminded KHALEE MAZO to expect a phone call prior to his visit.  Rilyn Scroggs C, CMA 10/13/2018 9:26 AM   INSTRUCTIONS FOR  DOWNLOADING THE MYCHART APP TO SMARTPHONE  - The patient must first make sure to have activated MyChart and know their login information - If Apple, go to [employer] and type in MyChart in the search bar and download the app. If Android, ask patient to go to [employer] and type in [place] in the search bar and download the app. The app is free but as with any other app downloads, their phone may require them to verify saved payment information or Apple/Android password.  - The patient will need to then log into the app with their MyChart username and  password, and select [facility] as their healthcare provider to link the account. When it is time for your visit, go to the MyChart app, find appointments, and click Begin Video Visit. Be sure to Select Allow for your device to access the Microphone and Camera for your visit. You will then be connected, and your provider will be with you shortly.  **If they have any issues connecting, or need assistance please contact MyChart service desk [phone])**  **If using a computer, in order to ensure the best quality for their visit they will need to use either of the following Internet Browsers: [employer], or Google Chrome**  IF USING DOXIMITY or DOXY.ME - The patient will receive a link just prior to their visit by text.     FULL LENGTH CONSENT FOR TELE-HEALTH VISIT   I hereby voluntarily request, consent and authorize [facility] and its employed or contracted physicians, physician assistants, nurse practitioners or other licensed health care professionals (the Practitioner), to provide me with telemedicine health care services (the "Services") as deemed necessary by the treating Practitioner. I acknowledge and consent to receive the Services by the Practitioner via telemedicine. I understand that the telemedicine visit will involve communicating with the Practitioner through live audiovisual communication technology and the disclosure of certain medical information by electronic transmission. I acknowledge that I have been given the opportunity to request an in-person assessment or other available alternative prior to the telemedicine visit and am voluntarily participating in the telemedicine visit.  I understand that I have the right to withhold or withdraw my consent to the use of telemedicine in the course of my care at any time, without affecting my right to future care or treatment, and that the Practitioner or I may terminate the telemedicine visit at any time. I  understand that I have the right to inspect all information obtained and/or recorded in the course of the telemedicine visit and may receive copies of available information for a reasonable fee.  I understand that some of the potential risks of receiving the Services via telemedicine include:  [name] Delay or interruption in medical evaluation due to technological equipment failure or disruption; . Information transmitted may not be sufficient (e.g. poor resolution of images) to allow for appropriate medical decision making by the Practitioner; and/or  . In rare instances, security protocols could fail, causing a breach of personal health information.  Furthermore, I acknowledge that it is my responsibility to provide information about my medical history, conditions and care that is complete and accurate to the best of my ability. I acknowledge that Practitioner's advice, recommendations, and/or decision may be based on factors not within their control, such as incomplete or inaccurate data provided by me or distortions of diagnostic images or specimens that may result from electronic transmissions. I understand that the practice of medicine is not an exact science and that  Practitioner makes no warranties or guarantees regarding treatment outcomes. I acknowledge that I will receive a copy of this consent concurrently upon execution via email to the email address I last provided but may also request a printed copy by calling the office of [place] [facility].    I understand that my insurance will be billed for this visit.   I have read or had this consent read to me. . I understand the contents of this consent, which adequately explains the benefits and risks of the Services being provided via telemedicine.  . I have been provided ample opportunity to ask questions regarding this consent and the Services and have had my questions answered to my satisfaction. . I give my informed consent for the services to be  provided through the use of telemedicine in my medical care  By participating in this telemedicine visit I agree to the above.

## 2018-10-13 NOTE — Progress Notes (Signed)
Virtual Visit via Video Note   This visit type was conducted due to national recommendations for restrictions regarding the COVID-19 Pandemic (e.g. social distancing) in an effort to limit this patient's exposure and mitigate transmission in our community.  Due to his co-morbid illnesses, this patient is at least at moderate risk for complications without adequate follow up.  This format is felt to be most appropriate for this patient at this time.  All issues noted in this document were discussed and addressed.  A limited physical exam was performed with this format.  Please refer to the patient's chart for his consent to telehealth for [facility].   Date:  10/14/2018   ID:  Patrick Weber, DOB Sep 18, 1945, MRN [id number]  Patient Location: Home Provider Location: Home  PCP:  Dione Housekeeper, MD  Cardiologist:  Nelva Bush, MD  Electrophysiologist:  None   Evaluation Performed:  Follow-Up Visit  Chief Complaint:  Follow-up CAD and thoracic aortic aneurysm  History of Present Illness:    Patrick Weber is a 73 y.o. male with  history of coronary artery disease status post STEMI with thrombotic occlusion of distal RCA in 09/2016, ischemic cardiomyopathy, and thoracic aortic aneurysm.  We are speaking today for follow-up of coronary artery disease and TAA.  I last saw Patrick Weber in September, at which time he was doing well.  We did not make any medication changes at that time.  Today, Patrick Weber reports feeling well.  His only complaint is of intermittent nocturia, which he attributes to losartan.  He denies chest pain, shortness of breath, edema, palpitations, and lightheadedness.  He remains compliant with his medications, including DAPT with ASA and ticagrelor 60 mg BID.  He has not had any significant bleeding.  The patient does not have symptoms concerning for COVID-19 infection (fever, chills, cough, or new shortness of breath).    Past Medical History:  Diagnosis Date  . Acute  ST elevation myocardial infarction (STEMI) of inferior wall ([place]) 09/18/2016  . Coronary artery disease involving native coronary artery of native heart with unstable angina pectoris ([place]) 09/18/2016   100% thrombotic RCA occlusion - DES x 2 with distal embolization.   . Hyperlipemia    Past Surgical History:  Procedure Laterality Date  . LEFT HEART CATH AND CORONARY ANGIOGRAPHY N/A 09/18/2016   Procedure: Left Heart Cath and Coronary Angiography;  Surgeon: Nelva Bush, MD;  Location: [facility];  Service: Cardiovascular;  Laterality: N/A;     Current Meds  Medication Sig  . aspirin EC 81 MG EC tablet Take 1 tablet (81 mg total) by mouth daily.  [name] atorvastatin (LIPITOR) 80 MG tablet Take 1 tablet (80 mg total) by mouth daily.  [name] BRILINTA 60 MG TABS tablet TAKE 1 TABLET (60 MG TOTAL) BY MOUTH 2 (TWO) TIMES DAILY.  [name] ezetimibe (ZETIA) 10 MG tablet Take 1 tablet (10 mg total) by mouth daily.  [name] losartan (COZAAR) 50 MG tablet Take 1 tablet (50 mg total) by mouth daily.  . metoprolol tartrate (LOPRESSOR) 25 MG tablet Take 1 tablet (25 mg total) by mouth 2 (two) times daily.  . nitroGLYCERIN (NITROSTAT) 0.4 MG SL tablet Place 1 tablet (0.4 mg total) under the tongue every 5 (five) minutes x 3 doses as needed for chest pain.     Allergies:   Lidocaine   Social History   Tobacco Use  . Smoking status: Never Smoker  . Smokeless tobacco: Never Used  Substance Use Topics  .  Alcohol use: Not on file  . Drug use: No     Family Hx: The patient's family history includes Emphysema in his father; Heart disease in his mother.  ROS:   Please see the history of present illness.   All other systems reviewed and are negative.   Prior CV studies:   The following studies were reviewed today:  Cath/PCI:  LHC/PCI (09/18/16):LMCA with 20% distal narrowing. LAD with long 60% proximal to mid vessel stenosis. Small D1 with 30% ostial. Small D2 with 60% ostial disease. Small ramus with  10-20% proximal disease. Small LCx without significant disease. Large, dominant and ectatic RCA with 40% ostial, 60% mid, and 100% thrombotic distal disease. Successful aspiration thrombectomy and PCI to mid and distal RCA with non-overlappingResolute Onyx4.0 x 22 mm (mid) and 4.0 x 26 mm (distal) drug-eluting stents. LVEDP 22 mmHg.  Non-Invasive Evaluation(s):  CTA chest (10/01/2018): Ascending thoracic aortic aneurysm measuring 4.7 cm at the sinuses of Valsalva and 4.0 cm in the proximal aorta.  No significant change from prior study.  CTA chest (10/16/17): Stable moderate dilation of the aortic root measuring 4.7 cm at the sinuses of Valsalva and 4.0 cm at the proximal ascending aorta.  No significant change since 02/2017.  TTE (02/13/17): Normal LV size with mild LVH. LVEF 45-50% with inferior hypokinesis. Aortic sclerosis with mild AI. Mildly thickened mitral valve with trivial MR. Mild left atrial enlargement. Mild TR. Mild pulmonary hypertension (RVSP 39 mmHg). Normal RV size and function.  CTA chest (10/08/16): Moderately dilated aortic root measuring 4.7 cm at the sinuses of Valsalva, 4.0 cm at the sinotubular junction, 3.9 cm the ascending aorta, and 2.9 cm at the arch.  TTE (09/19/16): Mildly dilated LV with mild LVH. LVEF 40-45% with inferior akinesis. Grade 1 diastolic dysfunction. Mild AI. Moderately dilated aortic root.  Labs/Other Tests and Data Reviewed:    EKG:  No ECG reviewed.  Recent Labs: 02/01/2018: Hemoglobin 15.0; Platelets 191 05/10/2018: ALT 36 09/22/2018: BUN 16; Creatinine, Ser 0.95; Potassium 4.2; Sodium 142   Recent Lipid Panel Lab Results  Component Value Date/Time   CHOL 124 05/10/2018 07:44 AM   TRIG 81 05/10/2018 07:44 AM   HDL 44 05/10/2018 07:44 AM   CHOLHDL 2.8 05/10/2018 07:44 AM   CHOLHDL 4.4 09/19/2016 12:41 AM   LDLCALC 64 05/10/2018 07:44 AM    Wt Readings from Last 3 Encounters:  10/14/18 185 lb (83.9 kg)  02/01/18 189 lb 12.8 oz (86.1  kg)  07/20/17 190 lb 9.6 oz (86.5 kg)     Objective:    Vital Signs:  Ht 6' (1.829 m)   Wt 185 lb (83.9 kg)   BMI 25.09 kg/m    VITAL SIGNS:  reviewed GEN:  no acute distress  ASSESSMENT & PLAN:    Coronary artery disease: No symptoms to suggest worsening coronary insufficiency.  He is tolerating his current regimen for secondary prevention well.  Given large, ectatic RCA and moderate diffuse disease involving the LAD and diagonal branches, we will plan to continue indefinite DAPT unless bleeding complications were to occur.  Thoracic aortic aneurysm: CTA chest shows stable moderate dilation of the aortic root.  We discussed the importance of continued blood pressure and lipid control.  Flouroquinolones should be avoided, given their association with aortopathy.  We will repeat a CTA of the chest in 1 year to ensure stability.  Hypertension: Patient does not check BP regularly at home.  Prior office readings have been normal.  We will continue  current doses of metoprolol and losartan (intermittent nocturia is not particularly bothersome and may be primarily due to BPH rather than medication effect).  Hyperlipidemia: LDL at goal.  We will continue atorvastatin 80 mg daily.  COVID-19 Education: The signs and symptoms of COVID-19 were discussed with the patient and how to seek care for testing (follow up with PCP or arrange E-visit).  The importance of social distancing was discussed today.  Time:   Today, I have spent 10 minutes with the patient with telehealth technology discussing the above problems.  An additional 10 minutes were spent reviewing the patient's chart and documenting today's encounter.   Medication Adjustments/Labs and Tests Ordered: Current medicines are reviewed at length with the patient today.  Concerns regarding medicines are outlined above.   Tests Ordered: CTA chest in 1 year for follow-up of thoracic aortic aneurysm.  Medication Changes: None.   Disposition:  Follow up in 1 year(s)  Signed, Yvonne Kendallhristopher Ieshia Hatcher, MD  10/14/2018 10:58 AM    [facility]

## 2018-10-14 ENCOUNTER — Telehealth (INDEPENDENT_AMBULATORY_CARE_PROVIDER_SITE_OTHER): Payer: Medicare HMO | Admitting: Internal Medicine

## 2018-10-14 ENCOUNTER — Encounter: Payer: Self-pay | Admitting: Internal Medicine

## 2018-10-14 ENCOUNTER — Other Ambulatory Visit: Payer: Self-pay

## 2018-10-14 VITALS — Ht 72.0 in | Wt 185.0 lb

## 2018-10-14 DIAGNOSIS — I2510 Atherosclerotic heart disease of native coronary artery without angina pectoris: Secondary | ICD-10-CM | POA: Diagnosis not present

## 2018-10-14 DIAGNOSIS — I10 Essential (primary) hypertension: Secondary | ICD-10-CM | POA: Diagnosis not present

## 2018-10-14 DIAGNOSIS — I712 Thoracic aortic aneurysm, without rupture, unspecified: Secondary | ICD-10-CM

## 2018-10-14 DIAGNOSIS — E785 Hyperlipidemia, unspecified: Secondary | ICD-10-CM

## 2018-10-14 NOTE — Patient Instructions (Signed)
Medication Instructions:  Your physician recommends that you continue on your current medications as directed. Please refer to the Current Medication list given to you today.  If you need a refill on your cardiac medications before your next appointment, please call your pharmacy.   Lab work: - None ordered.  If you have labs (blood work) drawn today and your tests are completely normal, you will receive your results only by: [name] MyChart Message (if you have MyChart) OR . A paper copy in the mail If you have any lab test that is abnormal or we need to change your treatment, we will call you to review the results.  Testing/Procedures:  The CTA of the Chest to evaluate for Thoracic Aortic Aneurysm in 1 year.  Non-Cardiac CT Angiography (CTA) of the chest, is a special type of CT scan that uses a computer to produce multi-dimensional views of major blood vessels throughout the body. In CT angiography, a contrast material is injected through an IV to help visualize the blood vessels. - You may possible need lab work prior to the CT.  - If your one year follow up appointment is nearing and you have not scheduled your CTA, please call our office to arrange.    Follow-Up: At [facility], you and your health needs are our priority.  As part of our continuing mission to provide you with exceptional heart care, we have created designated Provider Care Teams.  These Care Teams include your primary Cardiologist (physician) and Advanced Practice Providers (APPs -  Physician Assistants and Nurse Practitioners) who all work together to provide you with the care you need, when you need it. You will need a follow up appointment in 12 months following the CTA chest.  Please call our office 2 months in advance to schedule this appointment.  You may see Nelva Bush, MD or one of the following Advanced Practice Providers on your designated Care Team:   Murray Hodgkins, NP Christell Faith, PA-C . Marrianne Mood, PA-C

## 2018-11-28 ENCOUNTER — Other Ambulatory Visit: Payer: Self-pay | Admitting: Internal Medicine

## 2018-11-29 ENCOUNTER — Other Ambulatory Visit: Payer: Self-pay

## 2018-11-29 MED ORDER — ATORVASTATIN CALCIUM 80 MG PO TABS: 80.0000 mg | ORAL_TABLET | Freq: Every day | ORAL | 3 refills | Status: DC

## 2018-11-29 MED ORDER — METOPROLOL TARTRATE 25 MG PO TABS: 25.0000 mg | ORAL_TABLET | Freq: Two times a day (BID) | ORAL | 3 refills | Status: DC

## 2018-11-29 MED ORDER — LOSARTAN POTASSIUM 50 MG PO TABS: 50.0000 mg | ORAL_TABLET | Freq: Every day | ORAL | 3 refills | Status: DC

## 2018-11-29 MED ORDER — TICAGRELOR 60 MG PO TABS: 60.0000 mg | ORAL_TABLET | Freq: Two times a day (BID) | ORAL | 3 refills | Status: DC

## 2018-11-29 MED ORDER — EZETIMIBE 10 MG PO TABS: 10.0000 mg | ORAL_TABLET | Freq: Every day | ORAL | 3 refills | Status: DC

## 2018-11-29 NOTE — Telephone Encounter (Signed)
*  STAT* If patient is at the pharmacy, call can be transferred to refill team.   1. Which medications need to be refilled? (please list name of each medication and dose if known)  Lipitor, Brilinta, Zetia, Losartan, Metoprolol 2. Which pharmacy/location (including street and city if local pharmacy) is medication to be sent to?[facility] [place] 3. Do they need a 30 day or 90 day supply? [place]

## 2019-07-21 ENCOUNTER — Inpatient Hospital Stay (HOSPITAL_COMMUNITY)
Admission: EM | Admit: 2019-07-21 | Discharge: 2019-07-23 | DRG: 246 | Disposition: A | Discharge status: Home / Self Care | Payer: Medicare HMO | Attending: Internal Medicine | Admitting: Internal Medicine

## 2019-07-21 ENCOUNTER — Other Ambulatory Visit: Payer: Self-pay

## 2019-07-21 ENCOUNTER — Observation Stay (HOSPITAL_BASED_OUTPATIENT_CLINIC_OR_DEPARTMENT_OTHER): Payer: Medicare HMO

## 2019-07-21 ENCOUNTER — Emergency Department (HOSPITAL_COMMUNITY): Payer: Medicare HMO

## 2019-07-21 ENCOUNTER — Encounter (HOSPITAL_COMMUNITY): Payer: Self-pay

## 2019-07-21 DIAGNOSIS — I712 Thoracic aortic aneurysm, without rupture, unspecified: Secondary | ICD-10-CM | POA: Diagnosis present

## 2019-07-21 DIAGNOSIS — R079 Chest pain, unspecified: Secondary | ICD-10-CM | POA: Diagnosis not present

## 2019-07-21 DIAGNOSIS — Z955 Presence of coronary angioplasty implant and graft: Secondary | ICD-10-CM | POA: Diagnosis not present

## 2019-07-21 DIAGNOSIS — I200 Unstable angina: Secondary | ICD-10-CM | POA: Diagnosis present

## 2019-07-21 DIAGNOSIS — I255 Ischemic cardiomyopathy: Secondary | ICD-10-CM | POA: Diagnosis present

## 2019-07-21 DIAGNOSIS — I509 Heart failure, unspecified: Secondary | ICD-10-CM

## 2019-07-21 DIAGNOSIS — I25110 Atherosclerotic heart disease of native coronary artery with unstable angina pectoris: Principal | ICD-10-CM | POA: Diagnosis present

## 2019-07-21 DIAGNOSIS — I208 Other forms of angina pectoris: Secondary | ICD-10-CM | POA: Diagnosis not present

## 2019-07-21 DIAGNOSIS — I5023 Acute on chronic systolic (congestive) heart failure: Secondary | ICD-10-CM | POA: Diagnosis present

## 2019-07-21 DIAGNOSIS — Z20822 Contact with and (suspected) exposure to covid-19: Secondary | ICD-10-CM | POA: Diagnosis present

## 2019-07-21 DIAGNOSIS — Z95810 Presence of automatic (implantable) cardiac defibrillator: Secondary | ICD-10-CM | POA: Diagnosis not present

## 2019-07-21 DIAGNOSIS — Z7982 Long term (current) use of aspirin: Secondary | ICD-10-CM

## 2019-07-21 DIAGNOSIS — I5041 Acute combined systolic (congestive) and diastolic (congestive) heart failure: Secondary | ICD-10-CM | POA: Diagnosis not present

## 2019-07-21 DIAGNOSIS — Z7902 Long term (current) use of antithrombotics/antiplatelets: Secondary | ICD-10-CM | POA: Diagnosis not present

## 2019-07-21 DIAGNOSIS — E785 Hyperlipidemia, unspecified: Secondary | ICD-10-CM | POA: Diagnosis present

## 2019-07-21 DIAGNOSIS — Z79899 Other long term (current) drug therapy: Secondary | ICD-10-CM | POA: Diagnosis not present

## 2019-07-21 DIAGNOSIS — I252 Old myocardial infarction: Secondary | ICD-10-CM

## 2019-07-21 DIAGNOSIS — I2510 Atherosclerotic heart disease of native coronary artery without angina pectoris: Secondary | ICD-10-CM | POA: Diagnosis not present

## 2019-07-21 DIAGNOSIS — Z8249 Family history of ischemic heart disease and other diseases of the circulatory system: Secondary | ICD-10-CM | POA: Diagnosis not present

## 2019-07-21 DIAGNOSIS — I340 Nonrheumatic mitral (valve) insufficiency: Secondary | ICD-10-CM | POA: Diagnosis not present

## 2019-07-21 DIAGNOSIS — I351 Nonrheumatic aortic (valve) insufficiency: Secondary | ICD-10-CM | POA: Diagnosis not present

## 2019-07-21 DIAGNOSIS — I110 Hypertensive heart disease with heart failure: Secondary | ICD-10-CM | POA: Diagnosis present

## 2019-07-21 DIAGNOSIS — Z825 Family history of asthma and other chronic lower respiratory diseases: Secondary | ICD-10-CM

## 2019-07-21 DIAGNOSIS — I5021 Acute systolic (congestive) heart failure: Secondary | ICD-10-CM | POA: Diagnosis not present

## 2019-07-21 LAB — BASIC METABOLIC PANEL
Anion gap: 12 (ref 5–15)
Anion gap: 12 (ref 5–15)
BUN: 16 mg/dL (ref 8–23)
BUN: 13 mg/dL (ref 8–23)
CO2: 23 mmol/L (ref 22–32)
CO2: 24 mmol/L (ref 22–32)
Calcium: 8.7 mg/dL — ABNORMAL LOW (ref 8.9–10.3)
Calcium: 9 mg/dL (ref 8.9–10.3)
Chloride: 106 mmol/L (ref 98–111)
Chloride: 106 mmol/L (ref 98–111)
Creatinine, Ser: 0.93 mg/dL (ref 0.61–1.24)
Creatinine, Ser: 1 mg/dL (ref 0.61–1.24)
GFR calc Af Amer: >60 mL/min (ref >60)
GFR calc Af Amer: >60 mL/min (ref >60)
GFR calc non Af Amer: >60 mL/min (ref >60)
GFR calc non Af Amer: >60 mL/min (ref >60)
Glucose, Bld: 99 mg/dL (ref 70–99)
Glucose, Bld: 101 mg/dL — ABNORMAL HIGH (ref 70–99)
Potassium: 3.8 mmol/L (ref 3.5–5.1)
Potassium: 3.7 mmol/L (ref 3.5–5.1)
Sodium: 141 mmol/L (ref 135–145)
Sodium: 142 mmol/L (ref 135–145)

## 2019-07-21 LAB — ECHOCARDIOGRAM COMPLETE
Height: 72 in
Weight: 2960 oz

## 2019-07-21 LAB — CBC
HCT: 41.2 % (ref 39.0–52.0)
HCT: 44.6 % (ref 39.0–52.0)
Hemoglobin: 14.2 g/dL (ref 13.0–17.0)
Hemoglobin: 15 g/dL (ref 13.0–17.0)
MCH: 31.3 pg (ref 26.0–34.0)
MCH: 30.8 pg (ref 26.0–34.0)
MCHC: 34.5 g/dL (ref 30.0–36.0)
MCHC: 33.6 g/dL (ref 30.0–36.0)
MCV: 90.7 fL (ref 80.0–100.0)
MCV: 91.6 fL (ref 80.0–100.0)
Platelets: 182 10*3/uL (ref 150–400)
Platelets: 196 10*3/uL (ref 150–400)
RBC: 4.54 MIL/uL (ref 4.22–5.81)
RBC: 4.87 MIL/uL (ref 4.22–5.81)
RDW: 13 % (ref 11.5–15.5)
RDW: 13.2 % (ref 11.5–15.5)
WBC: 6.4 10*3/uL (ref 4.0–10.5)
WBC: 6.5 10*3/uL (ref 4.0–10.5)
nRBC: 0 % (ref 0.0–0.2)
nRBC: 0 % (ref 0.0–0.2)

## 2019-07-21 LAB — BRAIN NATRIURETIC PEPTIDE: B Natriuretic Peptide: 645.2 pg/mL — ABNORMAL HIGH (ref 0.0–100.0)

## 2019-07-21 LAB — TROPONIN I (HIGH SENSITIVITY)
Troponin I (High Sensitivity): 9 ng/L (ref <18)
Troponin I (High Sensitivity): 8 ng/L (ref <18)
Troponin I (High Sensitivity): 7 ng/L (ref <18)

## 2019-07-21 LAB — SARS CORONAVIRUS 2 (TAT 6-24 HRS): SARS Coronavirus 2: NEGATIVE

## 2019-07-21 LAB — MAGNESIUM: Magnesium: 2 mg/dL (ref 1.7–2.4)

## 2019-07-21 MED ORDER — METOPROLOL TARTRATE 25 MG PO TABS
25.0000 mg | ORAL_TABLET | Freq: Two times a day (BID) | ORAL | Status: DC
  Administered 2019-07-21 (×2): 25 mg via ORAL
  Filled 2019-07-21 (×2): qty 1

## 2019-07-21 MED ORDER — NITROGLYCERIN 0.4 MG/HR TD PT24
0.4000 mg | MEDICATED_PATCH | Freq: Every day | TRANSDERMAL | Status: DC
  Administered 2019-07-21 – 2019-07-23 (×2): 0.4 mg via TRANSDERMAL
  Filled 2019-07-21 (×3): qty 1

## 2019-07-21 MED ORDER — FUROSEMIDE 10 MG/ML IJ SOLN
40.0000 mg | Freq: Once | INTRAMUSCULAR | Status: AC
  Administered 2019-07-21: 40 mg via INTRAVENOUS
  Filled 2019-07-21: qty 4

## 2019-07-21 MED ORDER — FUROSEMIDE 10 MG/ML IJ SOLN: 40.0000 mg | Freq: Two times a day (BID) | INTRAMUSCULAR | Status: DC

## 2019-07-21 MED ORDER — ASPIRIN 81 MG PO CHEW
81.0000 mg | CHEWABLE_TABLET | ORAL | Status: AC
  Administered 2019-07-22: 81 mg via ORAL
  Filled 2019-07-21: qty 1

## 2019-07-21 MED ORDER — TICAGRELOR 60 MG PO TABS
60.0000 mg | ORAL_TABLET | Freq: Two times a day (BID) | ORAL | Status: DC
  Administered 2019-07-21 – 2019-07-22 (×3): 60 mg via ORAL
  Filled 2019-07-21 (×4): qty 1

## 2019-07-21 MED ORDER — ACETAMINOPHEN 650 MG RE SUPP: 650.0000 mg | Freq: Four times a day (QID) | RECTAL | Status: DC | PRN

## 2019-07-21 MED ORDER — ATORVASTATIN CALCIUM 80 MG PO TABS
80.0000 mg | ORAL_TABLET | Freq: Every day | ORAL | Status: DC
  Administered 2019-07-21 – 2019-07-22 (×2): 80 mg via ORAL
  Filled 2019-07-21 (×2): qty 1

## 2019-07-21 MED ORDER — ALUM & MAG HYDROXIDE-SIMETH 200-200-20 MG/5ML PO SUSP
30.0000 mL | Freq: Once | ORAL | Status: AC
  Administered 2019-07-21: 30 mL via ORAL
  Filled 2019-07-21: qty 30

## 2019-07-21 MED ORDER — ACETAMINOPHEN 325 MG PO TABS
650.0000 mg | ORAL_TABLET | Freq: Four times a day (QID) | ORAL | Status: DC | PRN
  Administered 2019-07-21: 650 mg via ORAL
  Filled 2019-07-21: qty 2

## 2019-07-21 MED ORDER — LOSARTAN POTASSIUM 50 MG PO TABS
50.0000 mg | ORAL_TABLET | Freq: Every day | ORAL | Status: DC
  Administered 2019-07-21 – 2019-07-23 (×2): 50 mg via ORAL
  Filled 2019-07-21 (×2): qty 1

## 2019-07-21 MED ORDER — NITROGLYCERIN 0.4 MG SL SUBL: 0.4000 mg | SUBLINGUAL_TABLET | SUBLINGUAL | Status: DC | PRN

## 2019-07-21 MED ORDER — SODIUM CHLORIDE 0.9 % IV SOLN: 250.0000 mL | INTRAVENOUS | Status: DC | PRN

## 2019-07-21 MED ORDER — EZETIMIBE 10 MG PO TABS
10.0000 mg | ORAL_TABLET | Freq: Every day | ORAL | Status: DC
  Administered 2019-07-21 – 2019-07-23 (×2): 10 mg via ORAL
  Filled 2019-07-21 (×2): qty 1

## 2019-07-21 MED ORDER — ASPIRIN EC 81 MG PO TBEC
81.0000 mg | DELAYED_RELEASE_TABLET | Freq: Every day | ORAL | Status: DC
  Administered 2019-07-21 – 2019-07-23 (×2): 81 mg via ORAL
  Filled 2019-07-21 (×2): qty 1

## 2019-07-21 MED ORDER — FUROSEMIDE 10 MG/ML IJ SOLN
40.0000 mg | Freq: Once | INTRAMUSCULAR | Status: AC
  Administered 2019-07-21: 17:00:00 40 mg via INTRAVENOUS
  Filled 2019-07-21: qty 4

## 2019-07-21 MED ORDER — ENOXAPARIN SODIUM 40 MG/0.4ML ~~LOC~~ SOLN
40.0000 mg | SUBCUTANEOUS | Status: DC
  Administered 2019-07-21: 40 mg via SUBCUTANEOUS
  Filled 2019-07-21: qty 0.4

## 2019-07-21 MED ORDER — ONDANSETRON HCL 4 MG PO TABS: 4.0000 mg | ORAL_TABLET | Freq: Four times a day (QID) | ORAL | Status: DC | PRN

## 2019-07-21 MED ORDER — SODIUM CHLORIDE 0.9 % IV SOLN: INTRAVENOUS | Status: DC

## 2019-07-21 MED ORDER — NITROGLYCERIN 2 % TD OINT
1.0000 [in_us] | TOPICAL_OINTMENT | Freq: Once | TRANSDERMAL | Status: AC
  Administered 2019-07-21: 04:00:00 1 [in_us] via TOPICAL
  Filled 2019-07-21: qty 1

## 2019-07-21 MED ORDER — SODIUM CHLORIDE 0.9% FLUSH: 3.0000 mL | Freq: Two times a day (BID) | INTRAVENOUS | Status: DC

## 2019-07-21 MED ORDER — SODIUM CHLORIDE 0.9% FLUSH: 3.0000 mL | Freq: Once | INTRAVENOUS | Status: DC

## 2019-07-21 MED ORDER — SODIUM CHLORIDE 0.9% FLUSH: 3.0000 mL | INTRAVENOUS | Status: DC | PRN

## 2019-07-21 MED ORDER — ONDANSETRON HCL 4 MG/2ML IJ SOLN: 4.0000 mg | Freq: Four times a day (QID) | INTRAMUSCULAR | Status: DC | PRN

## 2019-07-21 NOTE — H&P (Signed)
History and Physical    Patrick Weber NWG:[id number] DOB: 12-09-1945 DOA: 07/21/2019  PCP: Dione Housekeeper, MD  Patient coming from: Home.  Chief Complaint: Chest pain and shortness of breath.  HPI: Patrick Weber is a 74 y.o. male with history of ST elevation MI status post stenting in May 2018 with ischemic cardiomyopathy last EF measured was 45 to 50% in October 2018 with history of thoracic aortic aneurysm hypertension presents to the ER after patient started having sudden onset of chest pain and shortness of breath.  Patient symptoms started last night around 9 PM when patient was awakened by the chest pressure and shortness of breath.  Patient took 2 of his sublingual nitroglycerin despite which patient was still symptomatic and patient's son brought him to the ER.  Denies any fever chills productive cough abdominal pain nausea vomiting or diarrhea.  ED Course: In the ER patient still had persistent chest pain EKG shows normal sinus rhythm chest x-ray shows congestion consistent with pulmonary edema.  Chest pain resolved after patient was placed on nitroglycerin patch.  Labs are largely unremarkable except for BNP of 645 Covid test is pending.  Patient was given Lasix 40 mg IV admitted for acute on chronic systolic heart failure and chest pain concerning for angina.  Review of Systems: As per HPI, rest all negative.   Past Medical History:  Diagnosis Date  . Acute ST elevation myocardial infarction (STEMI) of inferior wall ([facility]) 09/18/2016  . Coronary artery disease involving native coronary artery of native heart with unstable angina pectoris ([facility]) 09/18/2016   100% thrombotic RCA occlusion - DES x 2 with distal embolization.   . Hyperlipemia     Past Surgical History:  Procedure Laterality Date  . LEFT HEART CATH AND CORONARY ANGIOGRAPHY N/A 09/18/2016   Procedure: Left Heart Cath and Coronary Angiography;  Surgeon: Nelva Bush, MD;  Location: [facility];  Service:  Cardiovascular;  Laterality: N/A;     reports that he has never smoked. He has never used smokeless tobacco. He reports that he does not use drugs. No history on file for alcohol.  Allergies  Allergen Reactions  . Lidocaine Other (See Comments)    Makes him sleepy.    Family History  Problem Relation Age of Onset  . Heart disease Mother   . Emphysema Father     Prior to Admission medications   Medication Sig Start Date End Date Taking? Authorizing Provider  aspirin EC 81 MG EC tablet Take 1 tablet (81 mg total) by mouth daily. 09/23/16  Yes Cheryln Manly, NP  atorvastatin (LIPITOR) 80 MG tablet Take 1 tablet (80 mg total) by mouth daily. 11/29/18  Yes End, Harrell Gave, MD  ezetimibe (ZETIA) 10 MG tablet Take 1 tablet (10 mg total) by mouth daily. 11/29/18  Yes End, Harrell Gave, MD  losartan (COZAAR) 50 MG tablet Take 1 tablet (50 mg total) by mouth daily. 11/29/18  Yes End, Harrell Gave, MD  metoprolol tartrate (LOPRESSOR) 25 MG tablet Take 1 tablet (25 mg total) by mouth 2 (two) times daily. 11/29/18  Yes End, Harrell Gave, MD  nitroGLYCERIN (NITROSTAT) 0.4 MG SL tablet Place 1 tablet (0.4 mg total) under the tongue every 5 (five) minutes x 3 doses as needed for chest pain. 09/22/16  Yes Cheryln Manly, NP  ticagrelor (BRILINTA) 60 MG TABS tablet Take 1 tablet (60 mg total) by mouth 2 (two) times daily. 11/29/18  Yes End, Harrell Gave, MD    Physical Exam: Constitutional: Moderately  built and nourished. Vitals:   07/21/19 0530 07/21/19 0545 07/21/19 0600 07/21/19 0639  BP: 115/82 116/90 133/82 122/86  Pulse: 79 85 86 95  Resp: 17 11 17 17   Temp:    98.1 F (36.7 C)  TempSrc:    Oral  SpO2: 96% 98% 99% 100%  Weight:      Height:       Eyes: Nonicteric no pallor. ENMT: No discharge from the ears eyes nose or mouth. Neck: No masses.  No neck rigidity. Respiratory: No rhonchi or crepitations. Cardiovascular: S1-S2 heard. Abdomen: Soft nontender bowel sounds present.  Musculoskeletal: No edema. Skin: No rash. Neurologic: Alert awake oriented to time place and person.  Moves all extremities. Psychiatric: Appears normal per normal affect.   Labs on Admission: I have personally reviewed following labs and imaging studies  CBC: Recent Labs  Lab 07/21/19 0107  WBC 6.4  HGB 14.2  HCT 41.2  MCV 90.7  PLT 182   Basic Metabolic Panel: Recent Labs  Lab 07/21/19 0107  NA 141  K 3.8  CL 106  CO2 23  GLUCOSE 99  BUN 16  CREATININE 0.93  CALCIUM 8.7*   GFR: Estimated Creatinine Clearance: 78.8 mL/min (by C-G formula based on SCr of 0.93 mg/dL). Liver Function Tests: No results for input(s): AST, ALT, ALKPHOS, BILITOT, PROT, ALBUMIN in the last 168 hours. No results for input(s): LIPASE, AMYLASE in the last 168 hours. No results for input(s): AMMONIA in the last 168 hours. Coagulation Profile: No results for input(s): INR, PROTIME in the last 168 hours. Cardiac Enzymes: No results for input(s): CKTOTAL, CKMB, CKMBINDEX, TROPONINI in the last 168 hours. BNP (last 3 results) No results for input(s): PROBNP in the last 8760 hours. HbA1C: No results for input(s): HGBA1C in the last 72 hours. CBG: No results for input(s): GLUCAP in the last 168 hours. Lipid Profile: No results for input(s): CHOL, HDL, LDLCALC, TRIG, CHOLHDL, LDLDIRECT in the last 72 hours. Thyroid Function Tests: No results for input(s): TSH, T4TOTAL, FREET4, T3FREE, THYROIDAB in the last 72 hours. Anemia Panel: No results for input(s): VITAMINB12, FOLATE, FERRITIN, TIBC, IRON, RETICCTPCT in the last 72 hours. Urine analysis: No results found for: COLORURINE, APPEARANCEUR, LABSPEC, PHURINE, GLUCOSEU, HGBUR, BILIRUBINUR, KETONESUR, PROTEINUR, UROBILINOGEN, NITRITE, LEUKOCYTESUR Sepsis Labs: @LABRCNTIP (procalcitonin:4,lacticidven:4) )No results found for this or any previous visit (from the past 240 hour(s)).   Radiological Exams on Admission: DG Chest 2 View  Result Date:  07/21/2019 CLINICAL DATA:  74 year old male with chest pain. EXAM: CHEST - 2 VIEW COMPARISON:  Chest radiograph dated 09/18/2016 and CT dated 10/01/2018 FINDINGS: Mild diffuse interstitial prominence, may be chronic or represent mild edema. Atypical pneumonia is not excluded. Clinical correlation is recommended. No focal consolidation, pleural effusion, pneumothorax. Stable cardiomegaly. No acute osseous pathology. Degenerative changes of the spine. IMPRESSION: Stable cardiomegaly with probable mild interstitial edema. Electronically Signed   By: 09/20/2016 M.D.   On: 07/21/2019 01:28    EKG: Independently reviewed.  Normal sinus rhythm.  Assessment/Plan Principal Problem:   Acute CHF (congestive heart failure) (HCC) Active Problems:   Coronary artery disease involving native coronary artery of native heart without angina pectoris   Thoracic aortic aneurysm without rupture (HCC)   Chest pain    1. Acute on chronic systolic heart failure last EF measured was 45 to 50% in October 2018.  Presently patient received IV Lasix 40 mg and also nitroglycerin patch.  Patient is also on ARB.  Closely follow intake output Daily weights  and metabolic panel. 2. Chest pain with history of CAD concerning for unstable angina has improved with nitroglycerin patch.  Troponins are negative patient is chest pain-free at this time.  Patient is on aspirin Brilinta statin beta-blocker Zetia.  Cardiology notified.  We will keep patient n.p.o. except medication for now until seen by cardiology. 3. History of thoracic aortic aneurysm presently chest pain improved.  Closely monitor.  Covid test is pending.   DVT prophylaxis: Lovenox. Code Status: Full code. Family Communication: Discussed with patient. Disposition Plan: Home. Consults called: Cardiology. Admission status: Observation.   Eduard Clos MD [facility] Pager [phone].  If 7PM-7AM, please contact night-coverage [email or web address]  Password [facility]  07/21/2019, 6:42 AM

## 2019-07-21 NOTE — ED Notes (Signed)
Care endorsed to April, RN. All questions answered

## 2019-07-21 NOTE — ED Notes (Signed)
Echo in progress. Pt alert, environment secured.

## 2019-07-21 NOTE — Consult Note (Addendum)
  Cardiology Consultation:   Patient ID: Patrick Weber; [id number]; 07/31/1945   Admit date: 07/21/2019 Date of Consult: 07/21/2019  Primary Care Provider: Nyland, Leonard, MD Primary Cardiologist: Dr. Christopher End, MD  Patient Profile:   Patrick Weber is a 74 y.o. male with a hx of CAD s/p STEMI with thrombotic occlusion of distal RCA in 09/2016, ischemic cardiomyopathy, and thoracic aortic aneurysm who is being seen today for the evaluation of acute on chronic CHF and chest pain at the request of Dr. Kakrakandy.  History of Present Illness:   Mr. Kuhrt is a 74-year-old male with a history stated above who presented to [facility] on 07/21/2019 with sudden onset of chest pain and shortness of breath which began approximately 1130 PM yesterday evening. Patient states that he was laying down to go to sleep when he experienced sudden onset of dyspnea followed by midsternal chest pressure at which time he took 2 SL NTG tablets with no relief.  Patient lives at home with his son and daughter-in-law.  Given his symptoms, the son was notified and he was transported to the ED for further evaluation.  Prior to yesterday evening, he was in his usual state of health with no CV since his STEMI in 2018.  He denies anginal symptoms, LE edema, orthopnea, PND, dizziness or syncope.  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  Reports complete compliance with his medications and follows a low-sodium diet.  In the ED, he had persistent chest pain.  EKG showed NSR with with PVC/PACs. BNP found to be elevated with 645. HST at 8>>>7 not consistent with ACS. CXR with cardiomegaly with probable mild interstitial edema. NTG patch placed with resolution of chest pain. He was given Lasix 40mg IV and admitted to hospitalist service for acute on chronic systolic heart failure and chest pain concerning for angina.   Mr. Matsumura is followed by Dr. End for his cardiology care.  He was initially seen by  her service 09/2016 after he presented to his PCP several days prior secondary to 2 hours of chest pain and nausea. There, he was found to have inferior ST segment elevation, prompting referral to [facility] via EMS for emergent left heart catheterization and possible PCI. He was found to have thrombotic occlusion of the distal RCA, which is a large, ectatic, and diffusely diseased vessel. This was successfully treated with 2 drug-eluting stents as well as aspiration thrombectomy. Embolization of thrombus into the distal branches was evident. The patient also had mild to moderate disease involving the LAD and its branches. Echo showed 40-45% with WMA. It was suggested that a "relook" cath be considered if the patient had any recurrent chest pain.   He was last seen 10/14/2018 by Dr. End for routine follow-up of CAD and TAA.  At that time, plan was for indefinite DAPT given large, ectatic RCA and moderate diffuse disease involving the LAD and diagonal branches.   Past Medical History:  Diagnosis Date  . Acute ST elevation myocardial infarction (STEMI) of inferior wall (H[facility]) 09/18/2016  . 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   . Hyperlipemia     Past Surgical History:  Procedure Laterality Date  . LEFT HEART CATH AND CORONARY ANGIOGRAPHY N/A 09/18/2016   Procedure: Left Heart Cath and Coronary Angiography;  Surgeon: End, Christopher, MD;    Location: [facility];  Service: Cardiovascular;  Laterality: N/A;     Prior to Admission medications   Medication Sig Start Date End Date Taking? Authorizing Provider  aspirin EC 81 MG EC tablet Take 1 tablet (81 mg total) by mouth daily. 09/23/16  Yes Roberts, Lindsay B, NP  atorvastatin (LIPITOR) 80 MG tablet Take 1 tablet (80 mg total) by mouth daily. 11/29/18  Yes End, Christopher, MD  ezetimibe (ZETIA) 10 MG  tablet Take 1 tablet (10 mg total) by mouth daily. 11/29/18  Yes End, Christopher, MD  losartan (COZAAR) 50 MG tablet Take 1 tablet (50 mg total) by mouth daily. 11/29/18  Yes End, Christopher, MD  metoprolol tartrate (LOPRESSOR) 25 MG tablet Take 1 tablet (25 mg total) by mouth 2 (two) times daily. 11/29/18  Yes End, Christopher, MD  nitroGLYCERIN (NITROSTAT) 0.4 MG SL tablet Place 1 tablet (0.4 mg total) under the tongue every 5 (five) minutes x 3 doses as needed for chest pain. 09/22/16  Yes Roberts, Lindsay B, NP  ticagrelor (BRILINTA) 60 MG TABS tablet Take 1 tablet (60 mg total) by mouth 2 (two) times daily. 11/29/18  Yes End, Christopher, MD    Inpatient Medications: Scheduled Meds: . aspirin EC  81 mg Oral Daily  . atorvastatin  80 mg Oral q1800  . enoxaparin (LOVENOX) injection  40 mg Subcutaneous Q24H  . ezetimibe  10 mg Oral Daily  . [START ON 07/22/2019] furosemide  40 mg Intravenous BID  . furosemide  40 mg Intravenous Once  . losartan  50 mg Oral Daily  . metoprolol tartrate  25 mg Oral BID  . nitroGLYCERIN  0.4 mg Transdermal Daily  . sodium chloride flush  3 mL Intravenous Once  . ticagrelor  60 mg Oral BID   Continuous Infusions:  PRN Meds: acetaminophen **OR** acetaminophen, nitroGLYCERIN, ondansetron **OR** ondansetron (ZOFRAN) IV  Allergies:    Allergies  Allergen Reactions  . Lidocaine Other (See Comments)    Makes him sleepy.    Social History:   Social History   Socioeconomic History  . Marital status: Married    Spouse name: Not on file  . Number of children: Not on file  . Years of education: Not on file  . Highest education level: Not on file  Occupational History  . Not on file  Tobacco Use  . Smoking status: Never Smoker  . Smokeless tobacco: Never Used  Substance and Sexual Activity  . Alcohol use: Not on file  . Drug use: No  . Sexual activity: Not on file  Other Topics Concern  . Not on file  Social History Narrative  . Not on file    Social Determinants of Health   Financial Resource Strain:   . Difficulty of Paying Living Expenses:   Food Insecurity:   . Worried About Running Out of Food in the Last Year:   . Ran Out of Food in the Last Year:   Transportation Needs:   . Lack of Transportation (Medical):   . Lack of Transportation (Non-Medical):   Physical Activity:   . Days of Exercise per Week:   . Minutes of Exercise per Session:   Stress:   . Feeling of Stress :   Social Connections:   . Frequency of Communication with Friends and Family:   . Frequency of Social Gatherings with Friends and Family:   . Attends Religious Services:   . Active Member of Clubs or Organizations:   . Attends Club or Organization   Meetings:   . Marital Status:   Intimate Partner Violence:   . Fear of Current or Ex-Partner:   . Emotionally Abused:   . Physically Abused:   . Sexually Abused:     Family History:   Family History  Problem Relation Age of Onset  . Heart disease Mother   . Emphysema Father    Family Status:  Family Status  Relation Name Status  . Mother  Deceased at age 91  . Father  Deceased  . Sister  Alive  . Brother  Alive  . MGM  Deceased  . MGF  Deceased  . PGM  Deceased  . PGF  Deceased    ROS:  Please see the history of present illness.  All other ROS reviewed and negative.     Physical Exam/Data:   Vitals:   07/21/19 0930 07/21/19 1015 07/21/19 1030 07/21/19 1100  BP: (!) 118/93 123/90 (!) 116/97 116/78  Pulse: 99 97 96 99  Resp: 17 14 19 12  Temp:      TempSrc:      SpO2: 97% 97% 97% 97%  Weight:      Height:        Intake/Output Summary (Last 24 hours) at 07/21/2019 1148 Last data filed at 07/21/2019 0940 Gross per 24 hour  Intake --  Output 2600 ml  Net -2600 ml   Filed Weights   07/21/19 0325  Weight: 83.9 kg   Body mass index is 25.09 kg/m.   General: Well developed, well nourished, NAD Head: Normocephalic, atraumatic, sclera non-icteric, no xanthomas, clear,  moist mucus membranes. Neck: Negative for carotid bruits. No JVD Lungs: Bilateral crackles in upper and lower lobes. Breathing is unlabored. Cardiovascular: RRR with S1 S2. No murmur.  Abdomen: Soft, non-tender, non-distended. No obvious abdominal masses. Extremities: No edema. Radial/DP/PT pulses 2+ bilaterally Neuro: Alert and oriented. No focal deficits. No facial asymmetry. MAE spontaneously. Psych: Responds to questions appropriately with normal affect.     EKG:  The EKG was personally reviewed and demonstrates: 07/21/19 NSR with PVC/PACs with no ST segment changes  Telemetry:  Telemetry was personally reviewed and demonstrates: 07/21/19 NSR with rates in the 80-90's   Relevant CV Studies:  Cath/PCI:  LHC/PCI (09/18/16):LMCA with 20% distal narrowing. LAD with long 60% proximal to mid vessel stenosis. Small D1 with 30% ostial. Small D2 with 60% ostial disease. Small ramus with 10-20% proximal disease. Small LCx without significant disease. Large, dominant and ectatic RCA with 40% ostial, 60% mid, and 100% thrombotic distal disease. Successful aspiration thrombectomy and PCI to mid and distal RCA with non-overlappingResolute Onyx4.0 x 22 mm (mid) and 4.0 x 26 mm (distal) drug-eluting stents. LVEDP 22 mmHg.  Non-Invasive Evaluation(s):  CTA chest (10/01/2018): Ascending thoracic aortic aneurysm measuring 4.7 cm at the sinuses of Valsalva and 4.0 cm in the proximal aorta.  No significant change from prior study.  CTA chest (10/16/17): Stable moderate dilation of the aortic root measuring 4.7 cm at the sinuses of Valsalva and 4.0 cm at the proximal ascending aorta. No significant change since 02/2017.  TTE (02/13/17): Normal LV size with mild LVH. LVEF 45-50% with inferior hypokinesis. Aortic sclerosis with mild AI. Mildly thickened mitral valve with trivial MR. Mild left atrial enlargement. Mild TR. Mild pulmonary hypertension (RVSP 39 mmHg). Normal RV size and function.  CTA  chest (10/08/16): Moderately dilated aortic root measuring 4.7 cm at the sinuses of Valsalva, 4.0 cm at the sinotubular junction, 3.9 cm the ascending aorta, and 2.9   cm at the arch.  TTE (09/19/16): Mildly dilated LV with mild LVH. LVEF 40-45% with inferior akinesis. Grade 1 diastolic dysfunction. Mild AI. Moderately dilated aortic root.  Laboratory Data:  Chemistry Recent Labs  Lab 07/21/19 0107 07/21/19 0654  NA 141 142  K 3.8 3.7  CL 106 106  CO2 23 24  GLUCOSE 99 101*  BUN 16 13  CREATININE 0.93 1.00  CALCIUM 8.7* 9.0  GFRNONAA >60 >60  GFRAA >60 >60  ANIONGAP 12 12    Total Protein  Date Value Ref Range Status  02/01/2018 6.7 6.0 - 8.5 g/dL Final   Albumin  Date Value Ref Range Status  02/01/2018 4.2 3.5 - 4.8 g/dL Final   AST  Date Value Ref Range Status  02/01/2018 19 0 - 40 IU/L Final   ALT  Date Value Ref Range Status  05/10/2018 36 0 - 44 IU/L Final   Alkaline Phosphatase  Date Value Ref Range Status  02/01/2018 77 39 - 117 IU/L Final   Bilirubin Total  Date Value Ref Range Status  02/01/2018 0.7 0.0 - 1.2 mg/dL Final   Hematology Recent Labs  Lab 07/21/19 0107 07/21/19 0654  WBC 6.4 6.5  RBC 4.54 4.87  HGB 14.2 15.0  HCT 41.2 44.6  MCV 90.7 91.6  MCH 31.3 30.8  MCHC 34.5 33.6  RDW 13.0 13.2  PLT 182 196   Cardiac EnzymesNo results for input(s): TROPONINI in the last 168 hours. No results for input(s): TROPIPOC in the last 168 hours.  BNP Recent Labs  Lab 07/21/19 0328  BNP 645.2*    DDimer No results for input(s): DDIMER in the last 168 hours. TSH:  Lab Results  Component Value Date   TSH 0.978 09/18/2016   Lipids: Lab Results  Component Value Date   CHOL 124 05/10/2018   HDL 44 05/10/2018   LDLCALC 64 05/10/2018   TRIG 81 05/10/2018   CHOLHDL 2.8 05/10/2018   HgbA1c: Lab Results  Component Value Date   HGBA1C 4.8 09/18/2016    Radiology/Studies:  DG Chest 2 View  Result Date: 07/21/2019 CLINICAL DATA:  73-year-old  male with chest pain. EXAM: CHEST - 2 VIEW COMPARISON:  Chest radiograph dated 09/18/2016 and CT dated 10/01/2018 FINDINGS: Mild diffuse interstitial prominence, may be chronic or represent mild edema. Atypical pneumonia is not excluded. Clinical correlation is recommended. No focal consolidation, pleural effusion, pneumothorax. Stable cardiomegaly. No acute osseous pathology. Degenerative changes of the spine. IMPRESSION: Stable cardiomegaly with probable mild interstitial edema. Electronically Signed   By: Arash  Radparvar M.D.   On: 07/21/2019 01:28   Assessment and Plan:    1. Chest pain with known CAD s/p STEMI 2018: -Patient presented to [facility] 07/21/2019 with acute onset of chest pain and shortness of breath relieved with nitroglycerin patch. Prior to yesterday evening, he was in his usual state of health with no CV since his STEMI in 2018. 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 Reports complete compliance with his medications and follows a low-sodium diet -HS troponin normal at 8>>> 7 not necessarily consistent with ACS however symptoms concerning given echo results as below.  -Creatinine stable at 1.00 -BNP found to be elevated at 645 with CXR consistent with pulmonary edema -Given IV Lasix 40 mg with good UO -At last follow-up with Dr. End, plan was for indefinite DAPT with ASA and Brilinta given ectatic RCA and moderate diffuse disease involving the LAD and diagonal branches, -More recent echocardiogram   today shows reduced EF at 25-30% per Dr. Avyukt Cimo.  -Given this, plan for further CV workup with [facility] tomorrow.   2. Acute on chronic systolic heart failure: -Last echocardiogram with LVEF at 45 to 50% 02/2017 who presented with fluid volume overload with a BNP of 645 and CXR consistent with pulmonary edema -Patient was given IV Lasix 40 mg in the ED with good UO -Weight, 185lb -I&O, net -2.6L -Continue with IV Lasix 40mg QD for now and trend renal function    3.Thoracic aortic aneurysm: -Most recent CTA chest with stable moderate dilation of the aortic root.   -Continue with optimal BP and lipid control  -Plan was for repeat CTA in 1 year to ensure stability -BP today 112/92, 118/92, 116/70  4.  Hypertension: -Stable, as above  -Continue metoprolol and losartan  5.  Hyperlipidemia: -LDL at goal -Continue high intensity atorvastatin 80 mg daily   For questions or updates, please contact [facility] Please consult [email or web address] for contact info under Cardiology/STEMI.   Signed, Jill McDaniel NP-C [facility] Pager: [phone] 07/21/2019 11:48 AM   I have seen and examined the patient along with Jill McDaniel NP-C.  I have reviewed the chart, notes and new data.  I agree with PA/NP's note.  Key new complaints: Had abrupt onset PND with angina in the middle of the night, after having performed physical work without difficulty the day before. Now breathing better, no longer has orthopnea or chest tightness after topical NTG and diuretics.  Key examination changes: clear lungs, no JVD, no edema, but S3 is present. Key new findings / data: normal hsTrop I, no major ECG changes, but echo shows substantial LV dilation (LVEDD 6.4 cm) and marked reduction in LVEF (25%) with global hypokinesis, 2+AI due to annular ectasia, 2+ central MR.  PLAN: Needs repeat coronary angiography. Concern for progression of LAD stenosis. This procedure has been fully reviewed with the patient and written informed consent has been obtained. Already on metoprolol and losartan. Consider switching to carvedilol and Entresto after cath, depending on findings. Will need daily diuretics. Depending on findings at cath, possible revascularization and progression of LVEF after optimizing medical therapy, may need AICD.  Akiya Morr, MD, FACC [facility] [phone] 07/21/2019, 2:09 PM     

## 2019-07-21 NOTE — ED Provider Notes (Signed)
[facility] EMERGENCY DEPARTMENT Provider Note  CSN: [id number] Arrival date & time: 07/21/19 0053  Chief Complaint(s) Chest Pain  HPI Patrick Weber is a 74 y.o. male   The history is provided by the patient.  Chest Pain Pain location:  Substernal area Pain quality: pressure   Pain radiates to:  Does not radiate Pain severity:  Moderate Onset quality:  Gradual Duration:  3 hours Timing:  Constant Progression:  Improving Chronicity: similar to prior MI. Relieved by:  Nitroglycerin Worsened by:  Nothing Associated symptoms: shortness of breath   Associated symptoms: no back pain, no cough, no fever, no headache, no heartburn, no nausea and no vomiting   Risk factors: coronary artery disease, high cholesterol, hypertension and male sex   Risk factors: no smoking     Past Medical History Past Medical History:  Diagnosis Date  . Acute ST elevation myocardial infarction (STEMI) of inferior wall (HCC) 09/18/2016  . 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   . Hyperlipemia    Patient Active Problem List   Diagnosis Date Noted  . Acute CHF (congestive heart failure) (HCC) 07/21/2019  . Chest pain 07/21/2019  . Acute on chronic systolic congestive heart failure (HCC)   . Essential hypertension 02/01/2018  . Ischemic cardiomyopathy 01/09/2017  . Thoracic aortic aneurysm without rupture (HCC) 01/09/2017  . Pressure injury of skin 09/22/2016  . Hyperlipidemia LDL goal <70 09/19/2016  . Hyperglycemia 09/19/2016  . Coronary artery disease involving native coronary artery of native heart without angina pectoris 09/18/2016   Home Medication(s) Prior to Admission medications   Medication Sig Start Date End Date Taking? Authorizing Provider  aspirin EC 81 MG EC tablet Take 1 tablet (81 mg total) by mouth daily. 09/23/16   Yes Arty Baumgartner, NP  atorvastatin (LIPITOR) 80 MG tablet Take 1 tablet (80 mg total) by mouth daily. 11/29/18  Yes End, Cristal Deer, MD  ezetimibe (ZETIA) 10 MG tablet Take 1 tablet (10 mg total) by mouth daily. 11/29/18  Yes End, Cristal Deer, MD  losartan (COZAAR) 50 MG tablet Take 1 tablet (50 mg total) by mouth daily. 11/29/18  Yes End, Cristal Deer, MD  metoprolol tartrate (LOPRESSOR) 25 MG tablet Take 1 tablet (25 mg total) by mouth 2 (two) times daily. 11/29/18  Yes End, Cristal Deer, MD  nitroGLYCERIN (NITROSTAT) 0.4 MG SL tablet Place 1 tablet (0.4 mg total) under the tongue every 5 (five) minutes x 3 doses as needed for chest pain. 09/22/16  Yes Arty Baumgartner, NP  ticagrelor (BRILINTA) 60 MG TABS tablet Take 1 tablet (60 mg total) by mouth 2 (two) times daily. 11/29/18  Yes End, Cristal Deer, MD  Past Surgical History Past Surgical History:  Procedure Laterality Date  . LEFT HEART CATH AND CORONARY ANGIOGRAPHY N/A 09/18/2016   Procedure: Left Heart Cath and Coronary Angiography;  Surgeon: [name], MD;  Location: [facility] INVASIVE CV LAB;  Service: Cardiovascular;  Laterality: N/A;   Family History Family History  Problem Relation Age of Onset  . Heart disease Mother   . Emphysema Father     Social History Social History   Tobacco Use  . Smoking status: Never Smoker  . Smokeless tobacco: Never Used  Substance Use Topics  . Alcohol use: Not on file  . Drug use: No   Allergies Lidocaine  Review of Systems Review of Systems  Constitutional: Negative for fever.  Respiratory: Positive for shortness of breath. Negative for cough.   Cardiovascular: Positive for chest pain.  Gastrointestinal: Negative for heartburn, nausea and vomiting.  Musculoskeletal: Negative for back pain.  Neurological: Negative for headaches.   All other systems are  reviewed and are negative for acute change except as noted in the HPI  Physical Exam Vital Signs  I have reviewed the triage vital signs BP (!) 126/91 (BP Location: Right Arm)   Pulse 95   Temp (!) 97.4 F (36.3 C) (Oral)   Resp 14   Ht 6' (1.829 m)   Wt 83.9 kg   SpO2 100%   BMI 25.09 kg/m   Physical Exam Vitals reviewed.  Constitutional:      General: He is not in acute distress.    Appearance: He is well-developed. He is not diaphoretic.  HENT:     Head: Normocephalic and atraumatic.     Nose: Nose normal.  Eyes:     General: No scleral icterus.       Right eye: No discharge.        Left eye: No discharge.     Conjunctiva/sclera: Conjunctivae normal.     Pupils: Pupils are equal, round, and reactive to light.  Cardiovascular:     Rate and Rhythm: Normal rate. Rhythm regularly irregular.     Heart sounds: No murmur. No friction rub. No gallop.   Pulmonary:     Effort: Pulmonary effort is normal. No respiratory distress.     Breath sounds: Normal breath sounds. No stridor. No rales.  Abdominal:     General: There is no distension.     Palpations: Abdomen is soft.     Tenderness: There is no abdominal tenderness.  Musculoskeletal:        General: No tenderness.     Cervical back: Normal range of motion and neck supple.  Skin:    General: Skin is warm and dry.     Findings: No erythema or rash.  Neurological:     Mental Status: He is alert and oriented to person, place, and time.     ED Results and Treatments Labs (all labs ordered are listed, but only abnormal results are displayed) Labs Reviewed  BASIC METABOLIC PANEL - Abnormal; Notable for the following components:      Result Value   Calcium 8.7 (*)    All other components within normal limits  BRAIN NATRIURETIC PEPTIDE - Abnormal; Notable for the following components:   B Natriuretic Peptide 645.2 (*)    All other components within normal limits  SARS CORONAVIRUS 2 (TAT 6-24 HRS)  CBC  CBC  BASIC  METABOLIC PANEL  MAGNESIUM  TROPONIN I (HIGH SENSITIVITY)  TROPONIN I (HIGH SENSITIVITY)  TROPONIN I (HIGH SENSITIVITY)  EKG  EKG Interpretation  Date/Time:  Thursday July 21 2019 00:59:06 EDT Ventricular Rate:  99 PR Interval:  212 QRS Duration: 90 QT Interval:  368 QTC Calculation: 472 R Axis:   -16 Text Interpretation: Sinus rhythm with 1st degree A-V block with Premature atrial complexes Nonspecific T wave abnormality Prolonged QT Abnormal ECG motion artifact Otherwise no significant change Reconfirmed by Drema Pry [phone]) on 07/21/2019 3:12:11 AM      Radiology DG Chest 2 View  Result Date: 07/21/2019 CLINICAL DATA:  74 year old male with chest pain. EXAM: CHEST - 2 VIEW COMPARISON:  Chest radiograph dated 09/18/2016 and CT dated 10/01/2018 FINDINGS: Mild diffuse interstitial prominence, may be chronic or represent mild edema. Atypical pneumonia is not excluded. Clinical correlation is recommended. No focal consolidation, pleural effusion, pneumothorax. Stable cardiomegaly. No acute osseous pathology. Degenerative changes of the spine. IMPRESSION: Stable cardiomegaly with probable mild interstitial edema. Electronically Signed   By: Elgie Collard M.D.   On: 07/21/2019 01:28    Pertinent labs & imaging results that were available during my care of the patient were reviewed by me and considered in my medical decision making (see chart for details).  Medications Ordered in ED Medications  sodium chloride flush (NS) 0.9 % injection 3 mL (3 mLs Intravenous Not Given 07/21/19 0528)  aspirin EC tablet 81 mg (has no administration in time range)  atorvastatin (LIPITOR) tablet 80 mg (has no administration in time range)  ezetimibe (ZETIA) tablet 10 mg (has no administration in time range)  losartan (COZAAR) tablet 50 mg (has no administration in time range)    metoprolol tartrate (LOPRESSOR) tablet 25 mg (has no administration in time range)  nitroGLYCERIN (NITROSTAT) SL tablet 0.4 mg (has no administration in time range)  ticagrelor (BRILINTA) tablet 60 mg (has no administration in time range)  acetaminophen (TYLENOL) tablet 650 mg (has no administration in time range)    Or  acetaminophen (TYLENOL) suppository 650 mg (has no administration in time range)  ondansetron (ZOFRAN) tablet 4 mg (has no administration in time range)    Or  ondansetron (ZOFRAN) injection 4 mg (has no administration in time range)  enoxaparin (LOVENOX) injection 40 mg (has no administration in time range)  nitroGLYCERIN (NITRODUR - Dosed in mg/24 hr) patch 0.4 mg (has no administration in time range)  nitroGLYCERIN (NITROGLYN) 2 % ointment 1 inch (1 inch Topical Given 07/21/19 0401)  alum & mag hydroxide-simeth (MAALOX/MYLANTA) 200-200-20 MG/5ML suspension 30 mL (30 mLs Oral Given 07/21/19 0401)  furosemide (LASIX) injection 40 mg (40 mg Intravenous Given 07/21/19 0533)                                                                                                                                    Procedures Procedures  (including critical care time)  Medical Decision Making / ED Course I have reviewed the nursing notes for this encounter and the patient's prior records (if  available in EHR or on provided paperwork).   Patrick Weber was evaluated in Emergency Department on 07/21/2019 for the symptoms described in the history of present illness. He was evaluated in the context of the global COVID-19 pandemic, which necessitated consideration that the patient might be at risk for infection with the SARS-CoV-2 virus that causes COVID-19. Institutional protocols and algorithms that pertain to the evaluation of patients at risk for COVID-19 are in a state of rapid change based on information released by regulatory bodies including the CDC and federal and state organizations.  These policies and algorithms were followed during the patient's care in the ED.  Substernal chest pain with associated shortness of breath.  EKG without acute ischemic changes or evidence of pericarditis.  Initial troponin negative.  Patient is already high risk for ACS given prior history.  Will require admission for ACS rule out.  Additionally, patient's chest x-ray revealed evidence of pulmonary edema.  BNP greater than 600.  Prior echoes from 2019 showed EF of 45 to 50%.  Patient not on diuretic.  Started on IV Lasix.  Low suspicion for pulmonary embolism.  Presentation not classic for rated dissection or esophageal perforation. Chest x-ray without evidence suggestive of pneumonia, pneumothorax, pneumomediastinum.  No abnormal contour of the mediastinum to suggest dissection. No evidence of acute injuries.   Admitted to medicine for continued work-up and management.      Final Clinical Impression(s) / ED Diagnoses Final diagnoses:  Acute on chronic systolic congestive heart failure ([place])      This chart was dictated using voice recognition software.  Despite best efforts to proofread,  errors can occur which can change the documentation meaning.   Fatima Blank, MD 07/21/19 [phone]

## 2019-07-21 NOTE — H&P (View-Only) (Signed)
Cardiology Consultation:   Patient ID: Patrick Weber; [id number]; 06-01-1945   Admit date: 07/21/2019 Date of Consult: 07/21/2019  Primary Care Provider: Joette Catching, MD Primary Cardiologist: Dr. [name], MD  Patient Profile:   Patrick Weber is a 74 y.o. male with a hx of CAD s/p STEMI with thrombotic occlusion of distal RCA in 09/2016, ischemic cardiomyopathy, and thoracic aortic aneurysm who is being seen today for the evaluation of acute on chronic CHF and chest pain at the request of Dr. Toniann Fail.  History of Present Illness:   Patrick Weber is a 74 year old male with a history stated above who presented to [facility] on 07/21/2019 with sudden onset of chest pain and shortness of breath which began approximately 1130 PM yesterday evening. Patient states that he was laying down to go to sleep when he experienced sudden onset of dyspnea followed by midsternal chest pressure at which time he took 2 SL NTG tablets with no relief.  Patient lives at home with his son and daughter-in-law.  Given his symptoms, the son was notified and he was transported to the ED for further evaluation.  Prior to yesterday evening, he was in his usual state of health with no CV since his STEMI in 2018.  He denies anginal symptoms, LE edema, orthopnea, PND, dizziness or syncope.  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  Reports complete compliance with his medications and follows a low-sodium diet.  In the ED, he had persistent chest pain.  EKG showed NSR with with PVC/PACs. BNP found to be elevated with 645. HST at 8>>>7 not consistent with ACS. CXR with cardiomegaly with probable mild interstitial edema. NTG patch placed with resolution of chest pain. He was given Lasix 40mg  IV and admitted to hospitalist service for acute on chronic systolic heart failure and chest pain concerning for angina.   Patrick Weber is followed by Dr. Randel Pigg for his cardiology care.  He was initially seen by  her service 09/2016 after he presented to his PCP several days prior secondary to 2 hours of chest pain and nausea. There, he was found to have inferior ST segment elevation, prompting referral to [facility] via EMS for emergent left heart catheterization and possible PCI. He was found to have thrombotic occlusion of the distal RCA, which is a large, ectatic, and diffusely diseased vessel. This was successfully treated with 2 drug-eluting stents as well as aspiration thrombectomy. Embolization of thrombus into the distal branches was evident. The patient also had mild to moderate disease involving the LAD and its branches. Echo showed 40-45% with WMA. It was suggested that a "relook" cath be considered if the patient had any recurrent chest pain.   He was last seen 10/14/2018 by Dr. 12/14/2018 for routine follow-up of CAD and TAA.  At that time, plan was for indefinite DAPT given large, ectatic RCA and moderate diffuse disease involving the LAD and diagonal branches.   Past Medical History:  Diagnosis Date  . Acute ST elevation myocardial infarction (STEMI) of inferior wall (HCC) 09/18/2016  . 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   . Hyperlipemia     Past Surgical History:  Procedure Laterality Date  . LEFT HEART CATH AND CORONARY ANGIOGRAPHY N/A 09/18/2016   Procedure: Left Heart Cath and Coronary Angiography;  Surgeon: 09/20/2016, MD;  Location: [facility] INVASIVE CV LAB;  Service: Cardiovascular;  Laterality: N/A;     Prior to Admission medications   Medication Sig Start Date End Date Taking? Authorizing Provider  aspirin EC 81 MG EC tablet Take 1 tablet (81 mg total) by mouth daily. 09/23/16  Yes Arty Baumgartner, NP  atorvastatin (LIPITOR) 80 MG tablet Take 1 tablet (80 mg total) by mouth daily. 11/29/18  Yes End, Cristal Deer, MD  ezetimibe (ZETIA) 10 MG  tablet Take 1 tablet (10 mg total) by mouth daily. 11/29/18  Yes End, Cristal Deer, MD  losartan (COZAAR) 50 MG tablet Take 1 tablet (50 mg total) by mouth daily. 11/29/18  Yes End, Cristal Deer, MD  metoprolol tartrate (LOPRESSOR) 25 MG tablet Take 1 tablet (25 mg total) by mouth 2 (two) times daily. 11/29/18  Yes End, Cristal Deer, MD  nitroGLYCERIN (NITROSTAT) 0.4 MG SL tablet Place 1 tablet (0.4 mg total) under the tongue every 5 (five) minutes x 3 doses as needed for chest pain. 09/22/16  Yes Arty Baumgartner, NP  ticagrelor (BRILINTA) 60 MG TABS tablet Take 1 tablet (60 mg total) by mouth 2 (two) times daily. 11/29/18  Yes End, Cristal Deer, MD    Inpatient Medications: Scheduled Meds: . aspirin EC  81 mg Oral Daily  . atorvastatin  80 mg Oral q1800  . enoxaparin (LOVENOX) injection  40 mg Subcutaneous Q24H  . ezetimibe  10 mg Oral Daily  . [START ON 07/22/2019] furosemide  40 mg Intravenous BID  . furosemide  40 mg Intravenous Once  . losartan  50 mg Oral Daily  . metoprolol tartrate  25 mg Oral BID  . nitroGLYCERIN  0.4 mg Transdermal Daily  . sodium chloride flush  3 mL Intravenous Once  . ticagrelor  60 mg Oral BID   Continuous Infusions:  PRN Meds: acetaminophen **OR** acetaminophen, nitroGLYCERIN, ondansetron **OR** ondansetron (ZOFRAN) IV  Allergies:    Allergies  Allergen Reactions  . Lidocaine Other (See Comments)    Makes him sleepy.    Social History:   Social History   Socioeconomic History  . Marital status: Married    Spouse name: Not on file  . Number of children: Not on file  . Years of education: Not on file  . Highest education level: Not on file  Occupational History  . Not on file  Tobacco Use  . Smoking status: Never Smoker  . Smokeless tobacco: Never Used  Substance and Sexual Activity  . Alcohol use: Not on file  . Drug use: No  . Sexual activity: Not on file  Other Topics Concern  . Not on file  Social History Narrative  . Not on file    Social Determinants of Health   Financial Resource Strain:   . Difficulty of Paying Living Expenses:   Food Insecurity:   . Worried About Programme researcher, broadcasting/film/video in the Last Year:   . Barista in the Last Year:   Transportation Needs:   . Freight forwarder (Medical):   [name] Lack of Transportation (Non-Medical):   Physical Activity:   . Days of Exercise per Week:   . Minutes of Exercise per Session:   Stress:   . Feeling of Stress :   Social Connections:   . Frequency of Communication with Friends and Family:   . Frequency of Social Gatherings with Friends and Family:   . Attends Religious Services:   . Active Member of Clubs or Organizations:   . Attends Banker  Meetings:   [name] Marital Status:   Intimate Partner Violence:   . Fear of Current or Ex-Partner:   . Emotionally Abused:   [name] Physically Abused:   . Sexually Abused:     Family History:   Family History  Problem Relation Age of Onset  . Heart disease Mother   . Emphysema Father    Family Status:  Family Status  Relation Name Status  . Mother  Deceased at age 73  . Father  Deceased  . Sister  Alive  . Brother  Alive  . MGM  Deceased  . MGF  Deceased  . PGM  Deceased  . PGF  Deceased    ROS:  Please see the history of present illness.  All other ROS reviewed and negative.     Physical Exam/Data:   Vitals:   07/21/19 0930 07/21/19 1015 07/21/19 1030 07/21/19 1100  BP: (!) 118/93 123/90 (!) 116/97 116/78  Pulse: 99 97 96 99  Resp: 17 14 19 12   Temp:      TempSrc:      SpO2: 97% 97% 97% 97%  Weight:      Height:        Intake/Output Summary (Last 24 hours) at 07/21/2019 1148 Last data filed at 07/21/2019 0940 Gross per 24 hour  Intake --  Output 2600 ml  Net -2600 ml   Filed Weights   07/21/19 0325  Weight: 83.9 kg   Body mass index is 25.09 kg/m.   General: Well developed, well nourished, NAD Head: Normocephalic, atraumatic, sclera non-icteric, no xanthomas, clear,  moist mucus membranes. Neck: Negative for carotid bruits. No JVD Lungs: Bilateral crackles in upper and lower lobes. Breathing is unlabored. Cardiovascular: RRR with S1 S2. No murmur.  Abdomen: Soft, non-tender, non-distended. No obvious abdominal masses. Extremities: No edema. Radial/DP/PT pulses 2+ bilaterally Neuro: Alert and oriented. No focal deficits. No facial asymmetry. MAE spontaneously. Psych: Responds to questions appropriately with normal affect.     EKG:  The EKG was personally reviewed and demonstrates: 07/21/19 NSR with PVC/PACs with no ST segment changes  Telemetry:  Telemetry was personally reviewed and demonstrates: 07/21/19 NSR with rates in the 80-90's   Relevant CV Studies:  Cath/PCI:  LHC/PCI (09/18/16):LMCA with 20% distal narrowing. LAD with long 60% proximal to mid vessel stenosis. Small D1 with 30% ostial. Small D2 with 60% ostial disease. Small ramus with 10-20% proximal disease. Small LCx without significant disease. Large, dominant and ectatic RCA with 40% ostial, 60% mid, and 100% thrombotic distal disease. Successful aspiration thrombectomy and PCI to mid and distal RCA with non-overlappingResolute Onyx4.0 x 22 mm (mid) and 4.0 x 26 mm (distal) drug-eluting stents. LVEDP 22 mmHg.  Non-Invasive Evaluation(s):  CTA chest (10/01/2018): Ascending thoracic aortic aneurysm measuring 4.7 cm at the sinuses of Valsalva and 4.0 cm in the proximal aorta.  No significant change from prior study.  CTA chest (10/16/17): Stable moderate dilation of the aortic root measuring 4.7 cm at the sinuses of Valsalva and 4.0 cm at the proximal ascending aorta. No significant change since 02/2017.  TTE (02/13/17): Normal LV size with mild LVH. LVEF 45-50% with inferior hypokinesis. Aortic sclerosis with mild AI. Mildly thickened mitral valve with trivial MR. Mild left atrial enlargement. Mild TR. Mild pulmonary hypertension (RVSP 39 mmHg). Normal RV size and function.  CTA  chest (10/08/16): Moderately dilated aortic root measuring 4.7 cm at the sinuses of Valsalva, 4.0 cm at the sinotubular junction, 3.9 cm the ascending aorta, and 2.9  cm at the arch.  TTE (09/19/16): Mildly dilated LV with mild LVH. LVEF 40-45% with inferior akinesis. Grade 1 diastolic dysfunction. Mild AI. Moderately dilated aortic root.  Laboratory Data:  Chemistry Recent Labs  Lab 07/21/19 0107 07/21/19 0654  NA 141 142  K 3.8 3.7  CL 106 106  CO2 23 24  GLUCOSE 99 101*  BUN 16 13  CREATININE 0.93 1.00  CALCIUM 8.7* 9.0  GFRNONAA >60 >60  GFRAA >60 >60  ANIONGAP 12 12    Total Protein  Date Value Ref Range Status  02/01/2018 6.7 6.0 - 8.5 g/dL Final   Albumin  Date Value Ref Range Status  02/01/2018 4.2 3.5 - 4.8 g/dL Final   AST  Date Value Ref Range Status  02/01/2018 19 0 - 40 IU/L Final   ALT  Date Value Ref Range Status  05/10/2018 36 0 - 44 IU/L Final   Alkaline Phosphatase  Date Value Ref Range Status  02/01/2018 77 39 - 117 IU/L Final   Bilirubin Total  Date Value Ref Range Status  02/01/2018 0.7 0.0 - 1.2 mg/dL Final   Hematology Recent Labs  Lab 07/21/19 0107 07/21/19 0654  WBC 6.4 6.5  RBC 4.54 4.87  HGB 14.2 15.0  HCT 41.2 44.6  MCV 90.7 91.6  MCH 31.3 30.8  MCHC 34.5 33.6  RDW 13.0 13.2  PLT 182 196   Cardiac EnzymesNo results for input(s): TROPONINI in the last 168 hours. No results for input(s): TROPIPOC in the last 168 hours.  BNP Recent Labs  Lab 07/21/19 0328  BNP 645.2*    DDimer No results for input(s): DDIMER in the last 168 hours. TSH:  Lab Results  Component Value Date   TSH 0.978 09/18/2016   Lipids: Lab Results  Component Value Date   CHOL 124 05/10/2018   HDL 44 05/10/2018   LDLCALC 64 05/10/2018   TRIG 81 05/10/2018   CHOLHDL 2.8 05/10/2018   HgbA1c: Lab Results  Component Value Date   HGBA1C 4.8 09/18/2016    Radiology/Studies:  DG Chest 2 View  Result Date: 07/21/2019 CLINICAL DATA:  74 year old  male with chest pain. EXAM: CHEST - 2 VIEW COMPARISON:  Chest radiograph dated 09/18/2016 and CT dated 10/01/2018 FINDINGS: Mild diffuse interstitial prominence, may be chronic or represent mild edema. Atypical pneumonia is not excluded. Clinical correlation is recommended. No focal consolidation, pleural effusion, pneumothorax. Stable cardiomegaly. No acute osseous pathology. Degenerative changes of the spine. IMPRESSION: Stable cardiomegaly with probable mild interstitial edema. Electronically Signed   By: Elgie Collard M.D.   On: 07/21/2019 01:28   Assessment and Plan:    1. Chest pain with known CAD s/p STEMI 2018: -Patient [facility] to [facility]21[facility]2019 with acute onset of chest pain and shortness of breath relieved with nitroglycerin patch. Prior to yesterday evening, he was in his usual state of health with no CV since his STEMI in 2018. 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 Reports complete compliance with his medications and follows a low-sodium diet -HS troponin normal at 8>>> 7 not necessarily consistent with ACS however symptoms concerning given echo results as below.  -Creatinine stable at 1.00 -BNP found to be elevated at 645 with CXR consistent with pulmonary edema -Given IV Lasix 40 mg with good UO -At last follow-up with Dr. Okey Dupre, plan was for indefinite DAPT with ASA and Brilinta given ectatic RCA and moderate diffuse disease involving the LAD and diagonal branches, -More recent echocardiogram  today shows reduced EF at 25-30% per Dr. Royann Shiversroitoru.  -Given this, plan for further CV workup with [facility] tomorrow.   2. Acute on chronic systolic heart failure: -Last echocardiogram with LVEF at 45 to 50% 02/2017 who presented with fluid volume overload with a BNP of 645 and CXR consistent with pulmonary edema -Patient was given IV Lasix 40 mg in the ED with good UO -Weight, 185lb -I&O, net -2.6L -Continue with IV Lasix 40mg  QD for now and trend renal function    3.Thoracic aortic aneurysm: -Most recent CTA chest with stable moderate dilation of the aortic root.   -Continue with optimal BP and lipid control  -Plan was for repeat CTA in 1 year to ensure stability -BP today 112/92, 118/92, 116/70  4.  Hypertension: -Stable, as above  -Continue metoprolol and losartan  5.  Hyperlipidemia: -LDL at goal -Continue high intensity atorvastatin 80 mg daily   For questions or updates, please contact [facility] Please consult [email or web address] for contact info under Cardiology/STEMI.   SignedGeorgie Chard, Jill McDaniel NP-C [facility] Pager: [phone] 07/21/2019 11:48 AM   I have seen and examined the patient along with Georgie ChardJill McDaniel NP-C.  I have reviewed the chart, notes and new data.  I agree with PA/NP's note.  Key new complaints: Had abrupt onset PND with angina in the middle of the night, after having performed physical work without difficulty the day before. Now breathing better, no longer has orthopnea or chest tightness after topical NTG and diuretics.  Key examination changes: clear lungs, no JVD, no edema, but S3 is present. Key new findings / data: normal hsTrop I, no major ECG changes, but echo shows substantial LV dilation (LVEDD 6.4 cm) and marked reduction in LVEF (25%) with global hypokinesis, 2+AI due to annular ectasia, 2+ central MR.  PLAN: Needs repeat coronary angiography. Concern for progression of LAD stenosis. This procedure has been fully reviewed with the patient and written informed consent has been obtained. Already on metoprolol and losartan. Consider switching to carvedilol and Entresto after cath, depending on findings. Will need daily diuretics. Depending on findings at cath, possible revascularization and progression of LVEF after optimizing medical therapy, may need AICD.  Thurmon FairMihai Kennetta Pavlovic, MD, [facility] [phone] 07/21/2019, 2:09 PM

## 2019-07-21 NOTE — Progress Notes (Signed)
Patient admitted to the hospital earlier this morning by Dr. Toniann Fail.  Patient seen and examined.  He is no longer having any chest discomfort and overall shortness of breath has improved.  Reports that his chest discomfort did improve after nitroglycerin paste was applied.  He does not have any significant lower extremity edema.  He does have crackles at bases on chest exam.   Assessment/plan  1. Acute on chronic systolic congestive heart failure.  Last ejection fraction was 45 to 50% in October 2018.  Will repeat echo.  He received a dose of intravenous Lasix on this morning.  We will continue Lasix twice daily.  He is also on nitroglycerin patch.  He is chronically on beta-blocker and ARB. 2. Chest pain with history of coronary artery disease.  He has had stent placed in the past.  He is chronically on antiplatelet therapy with aspirin and Brilinta.  Cardiac enzymes thus far are negative.  He is no longer having any chest discomfort.  Cardiology consulted.  Patient is currently n.p.o. in case any ischemic testing is needed today. 3. History of thoracic aortic aneurysm.  No further chest pain.  Continue to monitor. 4. Hyperlipidemia.  Continue statin 5. Disposition.  Anticipate discharge home once cleared by cardiology and no further inpatient cardiac work-up needed.  [employer]

## 2019-07-21 NOTE — ED Notes (Signed)
Pt states nitroglycerin paste patch removed by echo technician during exam, disposed of after removal

## 2019-07-21 NOTE — ED Triage Notes (Signed)
Pt reports that CP and SOB woke him up from his sleep, pt took two nitro without relief, hx of MI four years ago.

## 2019-07-21 NOTE — Progress Notes (Signed)
Echocardiogram 2D Echocardiogram has been performed.  Patrick Weber 07/21/2019, 10:57 AM

## 2019-07-21 NOTE — Care Management Obs Status (Signed)
MEDICARE OBSERVATION STATUS NOTIFICATION   Patient Details  Name: Patrick Weber MRN: [id number] Date of Birth: 1945-05-12   Medicare Observation Status Notification Given:  Yes    Epifanio Lesches, RN 07/21/2019, 4:27 PM

## 2019-07-21 NOTE — ED Notes (Signed)
Lasix given per [facility]. Name/DOB verified with pt

## 2019-07-21 NOTE — ED Notes (Signed)
PIV initiated, 18G to LAC. IV flushes with 10 cc NS without s/s of infiltration. Positive blood return noted. Secured with tape and tegaderm. Meds given per MAR. Name/DOB Verified with pt Covid swab collected, labeled with 2 pt identifiers, and brought to lab

## 2019-07-22 ENCOUNTER — Encounter (HOSPITAL_COMMUNITY)
Admission: EM | Disposition: A | Discharge status: Home / Self Care | Payer: Self-pay | Source: Home / Self Care | Attending: Internal Medicine

## 2019-07-22 DIAGNOSIS — I5023 Acute on chronic systolic (congestive) heart failure: Secondary | ICD-10-CM

## 2019-07-22 DIAGNOSIS — I509 Heart failure, unspecified: Secondary | ICD-10-CM

## 2019-07-22 DIAGNOSIS — I200 Unstable angina: Secondary | ICD-10-CM

## 2019-07-22 DIAGNOSIS — I25110 Atherosclerotic heart disease of native coronary artery with unstable angina pectoris: Principal | ICD-10-CM

## 2019-07-22 HISTORY — PX: LEFT HEART CATH AND CORONARY ANGIOGRAPHY: CATH118249

## 2019-07-22 HISTORY — PX: CORONARY STENT INTERVENTION: CATH118234

## 2019-07-22 HISTORY — PX: CORONARY BALLOON ANGIOPLASTY: CATH118233

## 2019-07-22 LAB — POCT ACTIVATED CLOTTING TIME
Activated Clotting Time: 301 seconds
Activated Clotting Time: 329 seconds

## 2019-07-22 LAB — BASIC METABOLIC PANEL
Anion gap: 13 (ref 5–15)
BUN: 16 mg/dL (ref 8–23)
CO2: 25 mmol/L (ref 22–32)
Calcium: 9.1 mg/dL (ref 8.9–10.3)
Chloride: 105 mmol/L (ref 98–111)
Creatinine, Ser: 1.04 mg/dL (ref 0.61–1.24)
GFR calc Af Amer: >60 mL/min (ref >60)
GFR calc non Af Amer: >60 mL/min (ref >60)
Glucose, Bld: 95 mg/dL (ref 70–99)
Potassium: 3.5 mmol/L (ref 3.5–5.1)
Sodium: 143 mmol/L (ref 135–145)

## 2019-07-22 LAB — CARDIAC CATHETERIZATION: Cath EF Quantitative: 25 %

## 2019-07-22 SURGERY — LEFT HEART CATH AND CORONARY ANGIOGRAPHY
Anesthesia: LOCAL

## 2019-07-22 MED ORDER — TICAGRELOR 90 MG PO TABS
ORAL_TABLET | ORAL | Status: AC
  Filled 2019-07-22: qty 2

## 2019-07-22 MED ORDER — HEPARIN SODIUM (PORCINE) 1000 UNIT/ML IJ SOLN
INTRAMUSCULAR | Status: AC
  Filled 2019-07-22: qty 1

## 2019-07-22 MED ORDER — HEPARIN (PORCINE) IN NACL 1000-0.9 UT/500ML-% IV SOLN
INTRAVENOUS | Status: AC
  Filled 2019-07-22: qty 1000

## 2019-07-22 MED ORDER — IOHEXOL 350 MG/ML SOLN
INTRAVENOUS | Status: DC | PRN
  Administered 2019-07-22: 190 mL

## 2019-07-22 MED ORDER — TICAGRELOR 90 MG PO TABS: 90.0000 mg | ORAL_TABLET | Freq: Two times a day (BID) | ORAL | 0 refills | Status: AC

## 2019-07-22 MED ORDER — TICAGRELOR 90 MG PO TABS
ORAL_TABLET | ORAL | Status: DC | PRN
  Administered 2019-07-22: 180 mg via ORAL

## 2019-07-22 MED ORDER — HEPARIN (PORCINE) IN NACL 1000-0.9 UT/500ML-% IV SOLN
INTRAVENOUS | Status: DC | PRN
  Administered 2019-07-22 (×2): 500 mL

## 2019-07-22 MED ORDER — MIDAZOLAM HCL 2 MG/2ML IJ SOLN
INTRAMUSCULAR | Status: AC
  Filled 2019-07-22: qty 2

## 2019-07-22 MED ORDER — VERAPAMIL HCL 2.5 MG/ML IV SOLN
INTRAVENOUS | Status: DC | PRN
  Administered 2019-07-22: 10 mL via INTRA_ARTERIAL

## 2019-07-22 MED ORDER — VERAPAMIL HCL 2.5 MG/ML IV SOLN
INTRAVENOUS | Status: AC
  Filled 2019-07-22: qty 2

## 2019-07-22 MED ORDER — SODIUM CHLORIDE 0.9% FLUSH: 3.0000 mL | INTRAVENOUS | Status: DC | PRN

## 2019-07-22 MED ORDER — LIDOCAINE HCL (PF) 1 % IJ SOLN
INTRAMUSCULAR | Status: DC | PRN
  Administered 2019-07-22: 2 mL

## 2019-07-22 MED ORDER — SODIUM CHLORIDE 0.9 % IV SOLN
INTRAVENOUS | Status: AC | PRN
  Administered 2019-07-22: 250 mL via INTRAVENOUS

## 2019-07-22 MED ORDER — HEPARIN SODIUM (PORCINE) 1000 UNIT/ML IJ SOLN
INTRAMUSCULAR | Status: DC | PRN
  Administered 2019-07-22: 7000 [IU] via INTRAVENOUS
  Administered 2019-07-22: 4000 [IU] via INTRAVENOUS

## 2019-07-22 MED ORDER — SODIUM CHLORIDE 0.9% FLUSH: 3.0000 mL | Freq: Two times a day (BID) | INTRAVENOUS | Status: DC

## 2019-07-22 MED ORDER — MIDAZOLAM HCL 2 MG/2ML IJ SOLN
INTRAMUSCULAR | Status: DC | PRN
  Administered 2019-07-22: 1 mg via INTRAVENOUS

## 2019-07-22 MED ORDER — LIDOCAINE HCL (PF) 1 % IJ SOLN
INTRAMUSCULAR | Status: AC
  Filled 2019-07-22: qty 30

## 2019-07-22 MED ORDER — METOPROLOL SUCCINATE ER 25 MG PO TB24
50.0000 mg | ORAL_TABLET | Freq: Every day | ORAL | Status: DC
  Administered 2019-07-23: 50 mg via ORAL
  Filled 2019-07-22: qty 2

## 2019-07-22 MED ORDER — SODIUM CHLORIDE 0.9 % IV SOLN: INTRAVENOUS | Status: AC

## 2019-07-22 MED ORDER — FENTANYL CITRATE (PF) 100 MCG/2ML IJ SOLN
INTRAMUSCULAR | Status: DC | PRN
  Administered 2019-07-22: 50 ug via INTRAVENOUS

## 2019-07-22 MED ORDER — TICAGRELOR 90 MG PO TABS
90.0000 mg | ORAL_TABLET | Freq: Two times a day (BID) | ORAL | Status: DC
  Administered 2019-07-22 – 2019-07-23 (×2): 90 mg via ORAL
  Filled 2019-07-22 (×2): qty 1

## 2019-07-22 MED ORDER — NITROGLYCERIN 1 MG/10 ML FOR IR/CATH LAB
INTRA_ARTERIAL | Status: DC | PRN
  Administered 2019-07-22: 100 ug via INTRACORONARY

## 2019-07-22 MED ORDER — FUROSEMIDE 40 MG PO TABS
40.0000 mg | ORAL_TABLET | Freq: Every day | ORAL | Status: DC
  Filled 2019-07-22: qty 1

## 2019-07-22 MED ORDER — FENTANYL CITRATE (PF) 100 MCG/2ML IJ SOLN
INTRAMUSCULAR | Status: AC
  Filled 2019-07-22: qty 2

## 2019-07-22 MED ORDER — SODIUM CHLORIDE 0.9 % IV SOLN: 250.0000 mL | INTRAVENOUS | Status: DC | PRN

## 2019-07-22 MED ORDER — NITROGLYCERIN 1 MG/10 ML FOR IR/CATH LAB
INTRA_ARTERIAL | Status: AC
  Filled 2019-07-22: qty 10

## 2019-07-22 MED FILL — BRILINTA 90 MG TABLET: 90 | 30 days supply | Qty: 60 | Fill #0

## 2019-07-22 SURGICAL SUPPLY — 21 items
BALLN SAPPHIRE 2.5X12 (BALLOONS) ×2
BALLN SAPPHIRE 2.75X20 (BALLOONS) ×2
BALLN ~~LOC~~ EMERGE MR 3.5X20 (BALLOONS) ×2
BALLOON SAPPHIRE 2.5X12 (BALLOONS) IMPLANT
BALLOON SAPPHIRE 2.75X20 (BALLOONS) IMPLANT
BALLOON ~~LOC~~ EMERGE MR 3.5X20 (BALLOONS) IMPLANT
CATH INFINITI 5FR ANG PIGTAIL (CATHETERS) ×1 IMPLANT
CATH INFINITI 5FR JK (CATHETERS) ×1 IMPLANT
CATH LAUNCHER 6FR EBU3.5 (CATHETERS) ×1 IMPLANT
DEVICE RAD COMP TR BAND LRG (VASCULAR PRODUCTS) ×1 IMPLANT
GLIDESHEATH SLEND SS 6F .021 (SHEATH) ×1 IMPLANT
GUIDEWIRE INQWIRE 1.5J.035X260 (WIRE) IMPLANT
INQWIRE 1.5J .035X260CM (WIRE) ×2
KIT ENCORE 26 ADVANTAGE (KITS) ×1 IMPLANT
KIT HEART LEFT (KITS) ×2 IMPLANT
PACK CARDIAC CATHETERIZATION (CUSTOM PROCEDURE TRAY) ×2 IMPLANT
STENT RESOLUTE ONYX 3.0X34 (Permanent Stent) ×1 IMPLANT
SYR MEDRAD MARK 7 150ML (SYRINGE) ×2 IMPLANT
TRANSDUCER W/STOPCOCK (MISCELLANEOUS) ×2 IMPLANT
TUBING CIL FLEX 10 FLL-RA (TUBING) ×2 IMPLANT
WIRE RUNTHROUGH .014X180CM (WIRE) ×2 IMPLANT

## 2019-07-22 NOTE — Care Management (Signed)
Per Daine Gravel. W/[employer] : Co-pay amount for Brilinta 90 mg.bid for 30 day supply $45.00.  No PA required No Deductible  Tier [facility] Retail Pharmacy : [facility], [facility].  Ref.# [id number]

## 2019-07-22 NOTE — Progress Notes (Signed)
PROGRESS NOTE    Patrick Weber  KCM:[id number] DOB: 1945/12/15 DOA: 07/21/2019 PCP: No primary care provider on file.    Brief Narrative:  Patrick Weber is a 74 year old gentleman with history of STEMI status post stenting May 2018, ischemic cardiomyopathy with EF 45-50% October 2018, thoracic aortic aneurysm, essential hypertension who presented to the ED with sudden onset chest pain and shortness of breath.  Patient reports symptoms started night prior around 9 PM when he was awakened by the chest pressure and dyspnea.  Patient took 2 sublingual nitroglycerin; in which patient remained symptomatic and his son brought him to the ED for further evaluation.  He denies any fever, no chills, no productive cough, no abdominal pain no nausea/vomiting or diarrhea.  In the ER patient still had persistent chest pain EKG shows normal sinus rhythm chest x-ray shows congestion consistent with pulmonary edema.  Chest pain resolved after patient was placed on nitroglycerin patch.  Labs are largely unremarkable except for BNP of 645 Covid test is pending.  Patient was given Lasix 40 mg IV admitted for acute on chronic systolic heart failure and chest pain concerning for angina.   Assessment & Plan:   Principal Problem:   Acute CHF (congestive heart failure) (HCC) Active Problems:   Coronary artery disease involving native coronary artery of native heart without angina pectoris   Thoracic aortic aneurysm without rupture (HCC)   Chest pain   Acute on chronic systolic CHF (congestive heart failure) (HCC)   Unstable angina (HCC)   Acute on chronic systolic congestive heart failure; 20-25% TTE 3/18 with notable drop in LVEF from  40-45% to 20-25% with global hypokinesis. Etiology likely 2/2 worsening ischemic cardiomyopathy with drop in EF to 20-25%.  BNP 645 with chest x-ray findings consistent with CHF exacerbation with pulmonary edema.  Initially started on IV diuresis with excellent urine output, 3.3L  past 24 hours. --Cardiology following, appreciate assistance --Furosemide 40 mg p.o. daily --continue losartan 57m PO daily, metoprolol succinate 50 mg p.o. daily --[facility]--Will need reevaluation of LVEF 3 months after revascularization and optimization of medical therapy; for assessment of ICD placement if EF remains less than 35%. --Continue monitor on telemetry --Strict I's and O's and daily weights  Chest pain with Hx CAD and STEMI 2018 Patient presenting with acute onset chest discomfort associate with shortness of breath.  Troponin 8-->7.  Repeat TTE 3/18 notable drop in EF from 40-45% to 20-25%. 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 --We will need yearly monitoring aorta via ultrasound  HLD: Continue atorvastatin 80 mg p.o. daily   DVT prophylaxis: Lovenox Code Status: Full code Family Communication: Discussed with patient extensively at bedside Disposition Plan:      Patient is from: Home with some     Anticipated Disposition:  Home with son     Barriers to discharge or conditions that needs to be met prior to discharge: Cardiology signed off, likely discharge 1-2 days  Consultants:   Cardiology - Dr. 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  States chest pain and shortness of breath have resolved.  Reports good urine output with IV Lasix yesterday.  No other  complaints or concerns at this time.  Denies headache, no fever/chills/night sweats, no nausea/vomiting/diarrhea, no chest pain, no palpitations, no shortness of breath, no weakness, no fatigue, no paresthesias.  No acute events overnight per nursing staff.  Objective: Vitals:   07/22/19 1120 07/22/19 1150 07/22/19 1220 07/22/19 1250  BP: 90/60 (!) 87/64 91/74 109/82  Pulse:      Resp:      Temp:      TempSrc:      SpO2:      Weight:      Height:        Intake/Output Summary (Last 24 hours) at 07/22/2019 1344 Last data filed at 07/22/2019 2549 Gross per 24 hour  Intake 640 ml  Output 1390 ml  Net -750 ml   Filed Weights   07/21/19 0325 07/21/19 1344 07/22/19 0624  Weight: 83.9 kg 79.7 kg 78.5 kg    Examination:  General exam: Appears calm and comfortable  Respiratory system: Clear to auscultation. Respiratory effort normal. Cardiovascular system: S1 & S2 heard, RRR. No JVD, murmurs, rubs, gallops or clicks. No pedal edema. Gastrointestinal system: Abdomen is nondistended, soft and nontender. No organomegaly or masses felt. Normal bowel sounds heard. Central nervous system: Alert and oriented. No focal neurological deficits. Extremities: Symmetric 5 x 5 power. Skin: No rashes, lesions or ulcers Psychiatry: Judgement and insight appear normal. Mood & affect appropriate.     Data Reviewed: I have personally reviewed following labs and imaging studies  CBC: Recent Labs  Lab 07/21/19 0107 07/21/19 0654  WBC 6.4 6.5  HGB 14.2 15.0  HCT 41.2 44.6  MCV 90.7 91.6  PLT 182 826   Basic Metabolic Panel: Recent Labs  Lab 07/21/19 0107 07/21/19 0654 07/22/19 0443  NA 141 142 143  K 3.8 3.7 3.5  CL 106 106 105  CO2 23 24 25   GLUCOSE 99 101* 95  BUN 16 13 16   CREATININE 0.93 1.00 1.04  CALCIUM 8.7* 9.0 9.1  MG  --  2.0  --    GFR: Estimated Creatinine Clearance: 70.5 mL/min (by C-G formula based on SCr of 1.04 mg/dL). Liver Function Tests: No results for  input(s): AST, ALT, ALKPHOS, BILITOT, PROT, ALBUMIN in the last 168 hours. No results for input(s): LIPASE, AMYLASE in the last 168 hours. No results for input(s): AMMONIA in the last 168 hours. Coagulation Profile: No results for input(s): INR, PROTIME in the last 168 hours. Cardiac Enzymes: No results for input(s): CKTOTAL, CKMB, CKMBINDEX, TROPONINI in the last 168 hours. BNP (last 3 results) No results for input(s): PROBNP in the last 8760 hours. HbA1C: No results for input(s): HGBA1C in the last 72 hours. CBG: No results for input(s): GLUCAP in the last 168 hours. Lipid Profile: No results for input(s): CHOL, HDL, LDLCALC, TRIG, CHOLHDL, LDLDIRECT in the last 72 hours. Thyroid Function Tests: No results for input(s): TSH, T4TOTAL, FREET4, T3FREE, THYROIDAB in the last 72 hours. Anemia Panel: No results for input(s): VITAMINB12, FOLATE, FERRITIN, TIBC, IRON, RETICCTPCT in the last 72 hours. Sepsis Labs: No results for input(s): PROCALCITON, LATICACIDVEN in the last 168 hours.  Recent Results (from the past 240 hour(s))  SARS CORONAVIRUS 2 (TAT 6-24 HRS) Nasopharyngeal Nasopharyngeal Swab     Status: None   Collection Time: 07/21/19  4:00 AM   Specimen: Nasopharyngeal Swab  Result Value Ref Range Status   SARS Coronavirus 2 NEGATIVE NEGATIVE Final    Comment: (NOTE) SARS-CoV-2 target nucleic acids are NOT DETECTED. The SARS-CoV-2 RNA is generally detectable in upper and lower respiratory specimens during the acute phase of infection. Negative results do not preclude SARS-CoV-2 infection, do not rule out co-infections with other pathogens, and should not be used as the sole basis for treatment or other patient management decisions. Negative results must be combined with clinical observations, patient history, and epidemiological information. The expected result is Negative. Fact Sheet for Patients: SugarRoll.be Fact Sheet for Healthcare  Providers: https://www.woods-mathews.com/ This test is not yet approved or cleared by the [place] FDA and  has been authorized for detection and/or diagnosis of SARS-CoV-2 by FDA under an Emergency Use Authorization (EUA). This EUA will remain  in effect (meaning this test can be used) for the duration of the COVID-19 declaration under Section 56 4(b)(1) of the Act, 21 U.S.C. section 360bbb-3(b)(1), unless the authorization is terminated or revoked sooner. Performed at [facility], [place] [address], [place], [place] [address]          Radiology Studies: DG Chest 2 View  Result Date: 07/21/2019 CLINICAL DATA:  74 year old male with chest pain. EXAM: CHEST - 2 VIEW COMPARISON:  Chest radiograph dated 09/18/2016 and CT dated 10/01/2018 FINDINGS: Mild diffuse interstitial prominence, may be chronic or represent mild edema. Atypical pneumonia is not excluded. Clinical correlation is recommended. No focal consolidation, pleural effusion, pneumothorax. Stable cardiomegaly. No acute osseous pathology. Degenerative changes of the spine. IMPRESSION: Stable cardiomegaly with probable mild interstitial edema. Electronically Signed   By: Anner Crete M.D.   On: 07/21/2019 01:28   CARDIAC CATHETERIZATION  Result Date: 07/22/2019  LM lesion is 20% stenosed.  Ost 1st Diag lesion is 30% stenosed.  Ost 3rd Diag lesion is 60% stenosed.  Prox LAD to Mid LAD lesion is 85% stenosed.  Ramus lesion is 15% stenosed.  Ost RCA lesion is 30% stenosed.  Previously placed Dist RCA drug eluting stent is widely patentwidely patent.  Prox RCA to Mid RCA lesion is 10% stenosed.  Mid RCA lesion is 50% stenosed.  1st Diag lesion is 80% stenosed.  2nd Diag lesion is 90% stenosed.  There is severe left ventricular systolic dysfunction.  LV end diastolic pressure is normal.  Post intervention, there is a 20% residual stenosis.  Balloon angioplasty was performed using a BALLOON SAPPHIRE  2.5X12.  Post intervention, there is a 30% residual stenosis.  Balloon angioplasty was performed using a BALLOON SAPPHIRE 2.5X12.  Post intervention, there is a 0% residual stenosis.  A drug-eluting stent was successfully placed using a STENT RESOLUTE ONYX 3.0X34.  1.  Significant underlying two-vessel coronary artery disease with widely patent RCA stent with no significant restenosis.  There is moderate mid RCA stenosis just distal to the previously placed stent.  This does not appear to be flow-limiting.  Progression of complex moderately calcified proximal to mid LAD disease involving 2 large diagonals which had significant ostial stenosis. 2.  Severely reduced LV systolic function with an EF of 25%.  Normal LVEDP at 8 mmHg. 3.  Successful complex bifurcation PCI with balloon angioplasty of 2 ostial diagonal branches followed by drug-eluting stent placement to the proximal/mid LAD with excellent results. 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  Recommend aggressive medical therapy. Continue dual antiplatelet therapy for at least 12 months and preferably indefinitely. I switched metoprolol tartrate  to metoprolol succinate given cardiomyopathy. If blood pressure allows, consider switching losartan to Entresto and adding spironolactone. The patient does not appear to be volume overloaded and I switch him from IV to oral furosemide. The burden of PVCs is concerning and might be contributing to his cardiomyopathy.  Consider EP evaluation.   ECHOCARDIOGRAM COMPLETE  Result Date: 07/21/2019    ECHOCARDIOGRAM REPORT   Patient Name:   Patrick Weber Date of Exam: 07/21/2019 Medical Rec #:  [id number]     Height:       72.0 in Accession #:    [id number]    Weight:       185.0 lb Date of Birth:  1945-10-21     BSA:          2.061 m Patient Age:    39 years      BP:           119/86 mmHg Patient Gender: M             HR:           99 bpm. Exam Location:  Inpatient  Procedure: 2D Echo, Color Doppler and Cardiac Doppler Indications:    P29.51 Acute systolic (congestive) heart failure  History:        Patient has prior history of Echocardiogram examinations, most                 recent 02/13/2017. CHF, CAD; Risk Factors:Hypertension and                 Dyslipidemia.  Sonographer:    Raquel Sarna Senior RDCS Referring Phys: [place]  1. Left ventricular ejection fraction, by estimation, is 20 to 25%. The left ventricle has severely decreased function. The left ventricle demonstrates global hypokinesis. The left ventricular internal cavity size was mildly to moderately dilated. Left ventricular diastolic function could not be evaluated. Left ventricular diastolic function could not be evaluated.  2. Right ventricular systolic function is normal. The right ventricular size is normal. Tricuspid regurgitation signal is inadequate for assessing PA pressure.  3. Left atrial size was severely dilated.  4. The mitral valve is grossly normal. Mild to moderate mitral valve regurgitation. No evidence of mitral stenosis.  5. The aortic valve is tricuspid. Aortic valve regurgitation is mild. No aortic stenosis is present.  6. 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 Aortic dilatation noted. Aneurysm of the ascending aorta, measuring 48 mm.  7. The inferior vena cava is dilated in size with <50% respiratory variability, suggesting right atrial pressure of 15 mmHg. Comparison(s): A prior study was performed on 02/13/2017. Prior images unable to be directly viewed, comparison made by report only. Changes from prior study are noted. The left ventricular function is significantly worse. EF now severely reduced. Ascending aortic aneurysm similar to last CTA. FINDINGS  Left Ventricle: Left ventricular ejection fraction, by estimation, is 20 to 25%. The left ventricle has severely decreased function. The left ventricle demonstrates global hypokinesis. The left ventricular internal cavity size  was mildly to moderately dilated. There is no left ventricular hypertrophy. Left ventricular diastolic function could not be evaluated due to atrial fibrillation. Left ventricular diastolic function could not be evaluated. Right Ventricle: The right ventricular size is normal. No increase in right ventricular wall thickness. Right ventricular systolic function is normal. Tricuspid regurgitation signal is inadequate for assessing PA pressure. Left Atrium: Left atrial size was severely dilated. Right Atrium: Right atrial size was normal in size. Pericardium: There  is no evidence of pericardial effusion. Mitral Valve: The mitral valve is grossly normal. Mild to moderate mitral valve regurgitation. No evidence of mitral valve stenosis. Tricuspid Valve: The tricuspid valve is grossly normal. Tricuspid valve regurgitation is trivial. No evidence of tricuspid stenosis. Aortic Valve: The aortic valve is tricuspid. Aortic valve regurgitation is mild. No aortic stenosis is present. Pulmonic Valve: The pulmonic valve was grossly normal. Pulmonic valve regurgitation is not visualized. No evidence of pulmonic stenosis. 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 Aortic dilatation noted. There is an aneurysm involving the ascending aorta. The aneurysm measures 48 mm. Venous: The inferior vena cava is dilated in size with less than 50% respiratory variability, suggesting right atrial pressure of 15 mmHg. IAS/Shunts: No atrial level shunt detected by color flow Doppler. EKG: Rhythm strip during this exam demostrated atrial fibrillation.  LEFT VENTRICLE PLAX 2D LVIDd:         6.40 cm LVIDs:         5.60 cm LV PW:         1.00 cm LV IVS:        1.00 cm LVOT diam:     2.50 cm LV SV:         58 LV SV Index:   28 LVOT Area:     4.91 cm  LV Volumes (MOD) LV vol d, MOD A2C: 159.0 ml LV vol d, MOD A4C: 223.0 ml LV vol s, MOD A2C: 119.0 ml LV vol s, MOD A4C: 136.0 ml LV SV MOD A2C:     40.0 ml LV SV MOD A4C:     223.0 ml LV SV MOD BP:       60.0 ml RIGHT VENTRICLE RV S prime:     12.30 cm/s TAPSE (M-mode): 1.8 cm LEFT ATRIUM              Index       RIGHT ATRIUM           Index LA diam:        3.80 cm  1.84 cm/m  RA Area:     20.50 cm LA Vol (A2C):   116.0 ml 56.28 ml/m RA Volume:   58.10 ml  28.19 ml/m LA Vol (A4C):   79.2 ml  38.43 ml/m LA Biplane Vol: 107.0 ml 51.91 ml/m  AORTIC VALVE LVOT Vmax:   68.70 cm/s LVOT Vmean:  47.400 cm/s LVOT VTI:    0.118 m  AORTA Ao Root diam: 4.60 cm Ao Asc diam:  4.80 cm  SHUNTS Systemic VTI:  0.12 m Systemic Diam: 2.50 cm Eleonore Chiquito MD Electronically signed by Eleonore Chiquito MD Signature Date/Time: 07/21/2019/2:46:58 PM    Final         Scheduled Meds: . aspirin EC  81 mg Oral Daily  . atorvastatin  80 mg Oral q1800  . enoxaparin (LOVENOX) injection  40 mg Subcutaneous Q24H  . ezetimibe  10 mg Oral Daily  . [START ON 07/23/2019] furosemide  40 mg Oral Daily  . losartan  50 mg Oral Daily  . [START ON 07/23/2019] metoprolol succinate  50 mg Oral Daily  . nitroGLYCERIN  0.4 mg Transdermal Daily  . sodium chloride flush  3 mL Intravenous Once  . sodium chloride flush  3 mL Intravenous Q12H  . sodium chloride flush  3 mL Intravenous Q12H  . ticagrelor  90 mg Oral BID   Continuous Infusions: . sodium chloride    . sodium chloride  LOS: 1 day    Time spent: 38 minutes spent on chart review, discussion with nursing staff, consultants, updating family and interview/physical exam; more than 50% of that time was spent in counseling and/or coordination of care.    Flecia Shutter J [place] DO [facility] Available via Epic secure chat 7am-7pm After these hours, please refer to coverage provider listed on [email or web address] 07/22/2019, 1:44 PM

## 2019-07-22 NOTE — Progress Notes (Signed)
Progress Note  Patient Name: Patrick Weber Date of Encounter: 07/22/2019  Primary Cardiologist: [name], MD   Subjective   No new events overnight. Seen in the Cath Lab and reviewed angiograms with Dr. Kirke Corin. Has high-grade and ulcerated plaque in the proximal and mid LAD.  Inpatient Medications    Scheduled Meds: . [MAR Hold] aspirin EC  81 mg Oral Daily  . [MAR Hold] atorvastatin  80 mg Oral q1800  . [MAR Hold] enoxaparin (LOVENOX) injection  40 mg Subcutaneous Q24H  . [MAR Hold] ezetimibe  10 mg Oral Daily  . [MAR Hold] furosemide  40 mg Intravenous BID  . [MAR Hold] losartan  50 mg Oral Daily  . [MAR Hold] metoprolol tartrate  25 mg Oral BID  . [MAR Hold] nitroGLYCERIN  0.4 mg Transdermal Daily  . [MAR Hold] sodium chloride flush  3 mL Intravenous Once  . [MAR Hold] sodium chloride flush  3 mL Intravenous Q12H  . [MAR Hold] ticagrelor  60 mg Oral BID   Continuous Infusions: . sodium chloride    . sodium chloride 10 mL/hr at 07/22/19 0626  . sodium chloride 250 mL (07/22/19 0842)   PRN Meds: sodium chloride, sodium chloride, [MAR Hold] acetaminophen **OR** [MAR Hold] acetaminophen, fentaNYL, Heparin (Porcine) in NaCl, heparin, lidocaine (PF), midazolam, [MAR Hold] nitroGLYCERIN, nitroGLYCERIN, [MAR Hold] ondansetron **OR** [MAR Hold] ondansetron (ZOFRAN) IV, Radial Cocktail/Verapamil only, sodium chloride flush   Vital Signs    Vitals:   07/21/19 2137 07/22/19 0614 07/22/19 0624 07/22/19 0758  BP: 110/81 113/73    Pulse: (!) 105 87    Resp: 18 17    Temp: 97.7 F (36.5 C) 98.3 F (36.8 C)    TempSrc: Oral Oral    SpO2: 98% 96%  99%  Weight:   78.5 kg   Height:        Intake/Output Summary (Last 24 hours) at 07/22/2019 0915 Last data filed at 07/22/2019 4481 Gross per 24 hour  Intake 640 ml  Output 2090 ml  Net -1450 ml   Last 3 Weights 07/22/2019 07/21/2019 07/21/2019  Weight (lbs) 173 lb 1.6 oz 175 lb 9.6 oz 185 lb  Weight (kg) 78.518 kg 79.652  kg 83.915 kg      Telemetry    Normal sinus rhythm- Personally Reviewed  ECG    No new tracing - Personally Reviewed  Physical Exam  Comfortable lying flat GEN: No acute distress.   Neck: No JVD Cardiac: RRR, no murmurs, rubs, or gallops.  Respiratory: Clear to auscultation bilaterally. GI: Soft, nontender, non-distended  MS: No edema; No deformity. Neuro:  Nonfocal  Psych: Normal affect   Labs    High Sensitivity Troponin:   Recent Labs  Lab 07/21/19 0107 07/21/19 0328 07/21/19 0654  TROPONINIHS 9 8 7       Chemistry Recent Labs  Lab 07/21/19 0107 07/21/19 0654 07/22/19 0443  NA 141 142 143  K 3.8 3.7 3.5  CL 106 106 105  CO2 23 24 25   GLUCOSE 99 101* 95  BUN 16 13 16   CREATININE 0.93 1.00 1.04  CALCIUM 8.7* 9.0 9.1  GFRNONAA >60 >60 >60  GFRAA >60 >60 >60  ANIONGAP 12 12 13      Hematology Recent Labs  Lab 07/21/19 0107 07/21/19 0654  WBC 6.4 6.5  RBC 4.54 4.87  HGB 14.2 15.0  HCT 41.2 44.6  MCV 90.7 91.6  MCH 31.3 30.8  MCHC 34.5 33.6  RDW 13.0 13.2  PLT 182 196  BNP Recent Labs  Lab 07/21/19 0328  BNP 645.2*     DDimer No results for input(s): DDIMER in the last 168 hours.   Radiology    DG Chest 2 View  Result Date: 07/21/2019 CLINICAL DATA:  74 year old male with chest pain. EXAM: CHEST - 2 VIEW COMPARISON:  Chest radiograph dated 09/18/2016 and CT dated 10/01/2018 FINDINGS: Mild diffuse interstitial prominence, may be chronic or represent mild edema. Atypical pneumonia is not excluded. Clinical correlation is recommended. No focal consolidation, pleural effusion, pneumothorax. Stable cardiomegaly. No acute osseous pathology. Degenerative changes of the spine. IMPRESSION: Stable cardiomegaly with probable mild interstitial edema. Electronically Signed   By: Elgie Collard M.D.   On: 07/21/2019 01:28   ECHOCARDIOGRAM COMPLETE  Result Date: 07/21/2019    ECHOCARDIOGRAM REPORT   Patient Name:   Patrick Weber Date of Exam:  07/21/2019 Medical Rec #:  [id number]     Height:       72.0 in Accession #:    [id number]    Weight:       185.0 lb Date of Birth:  30-Sep-1945     BSA:          2.061 m Patient Age:    73 years      BP:           119/86 mmHg Patient Gender: M             HR:           99 bpm. Exam Location:  Inpatient Procedure: 2D Echo, Color Doppler and Cardiac Doppler Indications:    I50.21 Acute systolic (congestive) heart failure  History:        Patient has prior history of Echocardiogram examinations, most                 recent 02/13/2017. CHF, CAD; Risk Factors:Hypertension and                 Dyslipidemia.  Sonographer:    Irving Burton Senior RDCS Referring Phys: [phone] JEHANZEB MEMON IMPRESSIONS  1. Left ventricular ejection fraction, by estimation, is 20 to 25%. The left ventricle has severely decreased function. The left ventricle demonstrates global hypokinesis. The left ventricular internal cavity size was mildly to moderately dilated. Left ventricular diastolic function could not be evaluated. Left ventricular diastolic function could not be evaluated.  2. Right ventricular systolic function is normal. The right ventricular size is normal. Tricuspid regurgitation signal is inadequate for assessing PA pressure.  3. Left atrial size was severely dilated.  4. The mitral valve is grossly normal. Mild to moderate mitral valve regurgitation. No evidence of mitral stenosis.  5. The aortic valve is tricuspid. Aortic valve regurgitation is mild. No aortic stenosis is present.  6. 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 Aortic dilatation noted. Aneurysm of the ascending aorta, measuring 48 mm.  7. The inferior vena cava is dilated in size with <50% respiratory variability, suggesting right atrial pressure of 15 mmHg. Comparison(s): A prior study was performed on 02/13/2017. Prior images unable to be directly viewed, comparison made by report only. Changes from prior study are noted. The left ventricular function is significantly worse. EF now  severely reduced. Ascending aortic aneurysm similar to last CTA. FINDINGS  Left Ventricle: Left ventricular ejection fraction, by estimation, is 20 to 25%. The left ventricle has severely decreased function. The left ventricle demonstrates global hypokinesis. The left ventricular internal cavity size was mildly to moderately dilated. There is no left ventricular  hypertrophy. Left ventricular diastolic function could not be evaluated due to atrial fibrillation. Left ventricular diastolic function could not be evaluated. Right Ventricle: The right ventricular size is normal. No increase in right ventricular wall thickness. Right ventricular systolic function is normal. Tricuspid regurgitation signal is inadequate for assessing PA pressure. Left Atrium: Left atrial size was severely dilated. Right Atrium: Right atrial size was normal in size. Pericardium: There is no evidence of pericardial effusion. Mitral Valve: The mitral valve is grossly normal. Mild to moderate mitral valve regurgitation. No evidence of mitral valve stenosis. Tricuspid Valve: The tricuspid valve is grossly normal. Tricuspid valve regurgitation is trivial. No evidence of tricuspid stenosis. Aortic Valve: The aortic valve is tricuspid. Aortic valve regurgitation is mild. No aortic stenosis is present. Pulmonic Valve: The pulmonic valve was grossly normal. Pulmonic valve regurgitation is not visualized. No evidence of pulmonic stenosis. 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 Aortic dilatation noted. There is an aneurysm involving the ascending aorta. The aneurysm measures 48 mm. Venous: The inferior vena cava is dilated in size with less than 50% respiratory variability, suggesting right atrial pressure of 15 mmHg. IAS/Shunts: No atrial level shunt detected by color flow Doppler. EKG: Rhythm strip during this exam demostrated atrial fibrillation.  LEFT VENTRICLE PLAX 2D LVIDd:         6.40 cm LVIDs:         5.60 cm LV PW:         1.00 cm LV IVS:         1.00 cm LVOT diam:     2.50 cm LV SV:         58 LV SV Index:   28 LVOT Area:     4.91 cm  LV Volumes (MOD) LV vol d, MOD A2C: 159.0 ml LV vol d, MOD A4C: 223.0 ml LV vol s, MOD A2C: 119.0 ml LV vol s, MOD A4C: 136.0 ml LV SV MOD A2C:     40.0 ml LV SV MOD A4C:     223.0 ml LV SV MOD BP:      60.0 ml RIGHT VENTRICLE RV S prime:     12.30 cm/s TAPSE (M-mode): 1.8 cm LEFT ATRIUM              Index       RIGHT ATRIUM           Index LA diam:        3.80 cm  1.84 cm/m  RA Area:     20.50 cm LA Vol (A2C):   116.0 ml 56.28 ml/m RA Volume:   58.10 ml  28.19 ml/m LA Vol (A4C):   79.2 ml  38.43 ml/m LA Biplane Vol: 107.0 ml 51.91 ml/m  AORTIC VALVE LVOT Vmax:   68.70 cm/s LVOT Vmean:  47.400 cm/s LVOT VTI:    0.118 m  AORTA Ao Root diam: 4.60 cm Ao Asc diam:  4.80 cm  SHUNTS Systemic VTI:  0.12 m Systemic Diam: 2.50 cm Eleonore Chiquito MD Electronically signed by Eleonore Chiquito MD Signature Date/Time: 07/21/2019/2:46:58 PM    Final     Cardiac Studies   Echocardiogram 07/21/2019  1. Left ventricular ejection fraction, by estimation, is 20 to 25%. The  left ventricle has severely decreased function. The left ventricle  demonstrates global hypokinesis. The left ventricular internal cavity size  was mildly to moderately dilated. Left  ventricular diastolic function could not be evaluated. Left ventricular  diastolic function could not be evaluated.  2. Right ventricular systolic function is normal. The right ventricular  size is normal. Tricuspid regurgitation signal is inadequate for assessing  PA pressure.  3. Left atrial size was severely dilated.  4. The mitral valve is grossly normal. Mild to moderate mitral valve  regurgitation. No evidence of mitral stenosis.  5. The aortic valve is tricuspid. Aortic valve regurgitation is mild. No  aortic stenosis is present.  6. 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 Aortic dilatation noted. Aneurysm of  the ascending aorta, measuring 48 mm.  7. The  inferior vena cava is dilated in size with <50% respiratory  variability, suggesting right atrial pressure of 15 mmHg.   Patient Profile     74 y.o. male with CAD status post inferior STEMI and stents to the RCA 2019, mild-moderate MR, mild-moderate AI due to moderate aneurysm of the ascending aorta (48 mm), previously with mildly depressed left ventricular systolic function, presents with PND, angina at rest and abrupt and severe reduction in LV ejection fraction (now 20-25% with global hypokinesis).  Assessment & Plan    1. CAD: Remarkably severe global left ventricular dysfunction despite the absence of an increase in cardiac markers suggest extensive myocardial stunning, possibly due to ulcerated plaque in the LAD artery with transient severe ischemia.  PCI will be a complex procedure, but will be performed today.  We will continue dual antiplatelet therapy and current dose of statin.  LDL is in target range. 2. CHF: LVEDP is normal at the time of cardiac catheterization, following approximately 3 L diuresis.  It is also likely that the LVEDP has improved due to recovery of some degree of myocardial stunning.  Need to continue treatment with ARB and beta-blocker and maximum tolerated doses.  Reevaluate LVEF 3 months after revascularization procedure and optimization of medical therapy.  To discuss ICD if LVEF remains less than 35%. 3. Thoracic aortic aneurysm: Monitor with yearly imaging. 4. AI: Mild-moderate, unlikely to be a cause of reduction in LV function. 5. MR: Mild-moderate, centrally directed jet, consistent with secondary MR     For questions or updates, please contact [facility] Please consult [email or web address] for contact info under        Signed, Thurmon Fair, MD  07/22/2019, 9:15 AM

## 2019-07-22 NOTE — Interval H&P Note (Signed)
Cath Lab Visit (complete for each Cath Lab visit)  Clinical Evaluation Leading to the Procedure:   ACS: Yes.   Unstable angina  NYHA class 4     History and Physical Interval Note:  07/22/2019 8:12 AM  Loma Sender  has presented today for surgery, with the diagnosis of Chest pain.  The various methods of treatment have been discussed with the patient and family. After consideration of risks, benefits and other options for treatment, the patient has consented to  Procedure(s): LEFT HEART CATH AND CORONARY ANGIOGRAPHY (N/A) as a surgical intervention.  The patient's history has been reviewed, patient examined, no change in status, stable for surgery.  I have reviewed the patient's chart and labs.  Questions were answered to the patient's satisfaction.     Lorine Bears

## 2019-07-22 NOTE — Progress Notes (Signed)
ReDS Clip Diuretic Study Pt study # [id number]  Your patient is in the Blinded arm of the ReDS Clip Diuretic study.  Your patient has had a ReDS reading and the reading has been transmitted to the cloud.   Thank You   The research team  Danae Orleans, PharmD, [facility] PGY2 Cardiology Pharmacy Resident Phone [phone] 07/22/2019       8:08 AM  Please check [email or web address] for unit-specific pharmacist phone numbers

## 2019-07-22 NOTE — Care Management (Addendum)
Provided patient with 30 day brilinta card, MD sent script to [facility] pharmacy, TOC aware via secure chat.   Patient states that he knows his copay is $35

## 2019-07-23 DIAGNOSIS — R079 Chest pain, unspecified: Secondary | ICD-10-CM

## 2019-07-23 DIAGNOSIS — I208 Other forms of angina pectoris: Secondary | ICD-10-CM

## 2019-07-23 DIAGNOSIS — I5041 Acute combined systolic (congestive) and diastolic (congestive) heart failure: Secondary | ICD-10-CM

## 2019-07-23 LAB — BASIC METABOLIC PANEL
Anion gap: 10 (ref 5–15)
BUN: 18 mg/dL (ref 8–23)
CO2: 24 mmol/L (ref 22–32)
Calcium: 8.7 mg/dL — ABNORMAL LOW (ref 8.9–10.3)
Chloride: 106 mmol/L (ref 98–111)
Creatinine, Ser: 1.02 mg/dL (ref 0.61–1.24)
GFR calc Af Amer: >60 mL/min (ref >60)
GFR calc non Af Amer: >60 mL/min (ref >60)
Glucose, Bld: 97 mg/dL (ref 70–99)
Potassium: 3.9 mmol/L (ref 3.5–5.1)
Sodium: 140 mmol/L (ref 135–145)

## 2019-07-23 LAB — CBC
HCT: 41.6 % (ref 39.0–52.0)
Hemoglobin: 14.5 g/dL (ref 13.0–17.0)
MCH: 31.1 pg (ref 26.0–34.0)
MCHC: 34.9 g/dL (ref 30.0–36.0)
MCV: 89.3 fL (ref 80.0–100.0)
Platelets: 162 10*3/uL (ref 150–400)
RBC: 4.66 MIL/uL (ref 4.22–5.81)
RDW: 12.9 % (ref 11.5–15.5)
WBC: 7 10*3/uL (ref 4.0–10.5)
nRBC: 0 % (ref 0.0–0.2)

## 2019-07-23 MED ORDER — ASPIRIN 81 MG PO TBEC: 81.0000 mg | DELAYED_RELEASE_TABLET | Freq: Every day | ORAL | 0 refills | Status: AC

## 2019-07-23 MED ORDER — FUROSEMIDE 40 MG PO TABS: 40.0000 mg | ORAL_TABLET | Freq: Every day | ORAL | 0 refills | Status: DC

## 2019-07-23 MED ORDER — EZETIMIBE 10 MG PO TABS: 10.0000 mg | ORAL_TABLET | Freq: Every day | ORAL | 0 refills | Status: AC

## 2019-07-23 MED ORDER — ATORVASTATIN CALCIUM 80 MG PO TABS: 80.0000 mg | ORAL_TABLET | Freq: Every day | ORAL | 0 refills | Status: AC

## 2019-07-23 MED ORDER — METOPROLOL SUCCINATE ER 50 MG PO TB24: 50.0000 mg | ORAL_TABLET | Freq: Every day | ORAL | 0 refills | Status: DC

## 2019-07-23 MED ORDER — LOSARTAN POTASSIUM 50 MG PO TABS: 50.0000 mg | ORAL_TABLET | Freq: Every day | ORAL | 0 refills | Status: DC

## 2019-07-23 NOTE — Discharge Instructions (Signed)
Heart Failure Action Plan A heart failure action plan helps you understand what to do when you have symptoms of heart failure. Follow the plan that was created by you and your health care provider. Review your plan each time you visit your health care provider. Red zone These signs and symptoms mean you should get medical help right away:  You have trouble breathing when resting.  You have a dry cough that is getting worse.  You have swelling or pain in your legs or abdomen that is getting worse.  You suddenly gain more than 2-3 lb (0.9-1.4 kg) in a day, or more than 5 lb (2.3 kg) in one week. This amount may be more or less depending on your condition.  You have trouble staying awake or you feel confused.  You have chest pain.  You do not have an appetite.  You pass out. If you experience any of these symptoms:  Call your local emergency services (911 in the [place] right away or seek help at the emergency department of the nearest hospital. Yellow zone These signs and symptoms mean your condition may be getting worse and you should make some changes:  You have trouble breathing when you are active or you need to sleep with extra pillows.  You have swelling in your legs or abdomen.  You gain 2-3 lb (0.9-1.4 kg) in one day, or 5 lb (2.3 kg) in one week. This amount may be more or less depending on your condition.  You get tired easily.  You have trouble sleeping.  You have a dry cough. If you experience any of these symptoms:  Contact your health care provider within the next day.  Your health care provider may adjust your medicines. Green zone These signs mean you are doing well and can continue what you are doing:  You do not have shortness of breath.  You have very little swelling or no new swelling.  Your weight is stable (no gain or loss).  You have a normal activity level.  You do not have chest pain or any other new symptoms. Follow these instructions at  home:  Take over-the-counter and prescription medicines only as told by your health care provider.  Weigh yourself daily. Your target weight is __________ lb (__________ kg). ? Call your health care provider if you gain more than __________ lb (__________ kg) in a day, or more than __________ lb (__________ kg) in one week.  Eat a heart-healthy diet. Work with a diet and nutrition specialist (dietitian) to create an eating plan that is best for you.  Keep all follow-up visits as told by your health care provider. This is important. Where to find more information  American Heart Association: www.heart.org Summary  Follow the action plan that was created by you and your health care provider.  Get help right away if you have any symptoms in the Red zone. This information is not intended to replace advice given to you by your health care provider. Make sure you discuss any questions you have with your health care provider. Document Revised: 04/03/2017 Document Reviewed: 05/31/2016 Elsevier Patient Education  2020 [employer] Heart Failure Eating Plan Heart failure, also called congestive heart failure, occurs when your heart does not pump blood well enough to meet your body's needs for oxygen-rich blood. Heart failure is a long-term (chronic) condition. Living with heart failure can be challenging. However, following your health care provider's instructions about a healthy lifestyle and working with a diet and   nutrition specialist (dietitian) to choose the right foods may help to improve your symptoms. What are tips for following this plan? Reading food labels  Check food labels for the amount of sodium per serving. Choose foods that have less than 140 mg (milligrams) of sodium in each serving.  Check food labels for the number of calories per serving. This is important if you need to limit your daily calorie intake to lose weight.  Check food labels for the serving size. If you eat more  than one serving, you will be eating more sodium and calories than what is listed on the label.  Look for foods that are labeled as "sodium-free," "very low sodium," or "low sodium." ? Foods labeled as "reduced sodium" or "lightly salted" may still have more sodium than what is recommended for you. Cooking  Avoid adding salt when cooking. Ask your health care provider or dietitian before using salt substitutes.  Season food with salt-free seasonings, spices, or herbs. Check the label of seasoning mixes to make sure they do not contain salt.  Cook with heart-healthy oils, such as olive, canola, soybean, or sunflower oil.  Do not fry foods. Cook foods using low-fat methods, such as baking, boiling, grilling, and broiling.  Limit unhealthy fats when cooking by: ? Removing the skin from poultry, such as chicken. ? Removing all visible fats from meats. ? Skimming the fat off from stews, soups, and gravies before serving them. Meal planning   Limit your intake of: ? Processed, canned, or pre-packaged foods. ? Foods that are high in trans fat, such as fried foods. ? Sweets, desserts, sugary drinks, and other foods with added sugar. ? Full-fat dairy products, such as whole milk.  Eat a balanced diet that includes: ? 4-5 servings of fruit each day and 4-5 servings of vegetables each day. At each meal, try to fill half of your plate with fruits and vegetables. ? Up to 6-8 servings of whole grains each day. ? Up to 2 servings of lean meat, poultry, or fish each day. One serving of meat is equal to 3 oz. This is about the same size as a deck of cards. ? 2 servings of low-fat dairy each day. ? Heart-healthy fats. Healthy fats called omega-3 fatty acids are found in foods such as flaxseed and cold-water fish like sardines, salmon, and mackerel.  Aim to eat 25-35 g (grams) of fiber a day. Foods that are high in fiber include apples, broccoli, carrots, beans, peas, and whole grains.  Do not add  salt or condiments that contain salt (such as soy sauce) to foods before eating.  When eating at a restaurant, ask that your food be prepared with less salt or no salt, if possible.  Try to eat 2 or more vegetarian meals each week.  Eat more home-cooked food and eat less restaurant, buffet, and fast food. General information  Do not eat more than 2,300 mg of salt (sodium) a day. The amount of sodium that is recommended for you may be lower, depending on your condition.  Maintain a healthy body weight as directed. Ask your health care provider what a healthy weight is for you. ? Check your weight every day. ? Work with your health care provider and dietitian to make a plan that is right for you to lose weight or maintain your current weight.  Limit how much fluid you drink. Ask your health care provider or dietitian how much fluid you can have each day.  Limit or   avoid alcohol as told by your health care provider or dietitian. Recommended foods The items listed may not be a complete list. Talk with your dietitian about what dietary choices are best for you. Fruits All fresh, frozen, and canned fruits. Dried fruits, such as raisins, prunes, and cranberries. Vegetables All fresh vegetables. Vegetables that are frozen without sauce or added salt. Low-sodium or sodium-free canned vegetables. Grains Bread with less than 80 mg of sodium per slice. Whole-wheat pasta, quinoa, and brown rice. Oats and oatmeal. Barley. Millet. Grits and cream of wheat. Whole-grain and whole-wheat cold cereal. Meats and other protein foods Lean cuts of meat. Skinless chicken and turkey. Fish with high omega-3 fatty acids, such as salmon, sardines, and other cold-water fishes. Eggs. Dried beans, peas, and edamame. Unsalted nuts and nut butters. Dairy Low-fat or nonfat (skim) milk and dried milk. Rice milk, soy milk, and almond milk. Low-fat or nonfat yogurt. Small amounts of reduced-sodium block cheese. Low-sodium  cottage cheese. Fats and oils Olive, canola, soybean, flaxseed, or sunflower oil. Avocado. Sweets and desserts Apple sauce. Granola bars. Sugar-free pudding and gelatin. Frozen fruit bars. Seasoning and other foods Fresh and dried herbs. Lemon or lime juice. Vinegar. Low-sodium ketchup. Salt-free marinades, salad dressings, sauces, and seasonings. The items listed above may not be a complete list of foods and beverages you can eat. Contact a dietitian for more information. Foods to avoid The items listed may not be a complete list. Talk with your dietitian about what dietary choices are best for you. Fruits Fruits that are dried with sodium-containing preservatives. Vegetables Canned vegetables. Frozen vegetables with sauce or seasonings. Creamed vegetables. French fries. Onion rings. Pickled vegetables and sauerkraut. Grains Bread with more than 80 mg of sodium per slice. Hot or cold cereal with more than 140 mg sodium per serving. Salted pretzels and crackers. Pre-packaged breadcrumbs. Bagels, croissants, and biscuits. Meats and other protein foods Ribs and chicken wings. Bacon, ham, pepperoni, bologna, salami, and packaged luncheon meats. Hot dogs, bratwurst, and sausage. Canned meat. Smoked meat and fish. Salted nuts and seeds. Dairy Whole milk, half-and-half, and cream. Buttermilk. Processed cheese, cheese spreads, and cheese curds. Regular cottage cheese. Feta cheese. Shredded cheese. String cheese. Fats and oils Butter, lard, shortening, ghee, and bacon fat. Canned and packaged gravies. Seasoning and other foods Onion salt, garlic salt, table salt, and sea salt. Marinades. Regular salad dressings. Relishes, pickles, and olives. Meat flavorings and tenderizers, and bouillon cubes. Horseradish, ketchup, and mustard. Worcestershire sauce. Teriyaki sauce, soy sauce (including reduced sodium). Hot sauce and Tabasco sauce. Steak sauce, fish sauce, oyster sauce, and cocktail sauce. Taco  seasonings. Barbecue sauce. Tartar sauce. The items listed above may not be a complete list of foods and beverages you should avoid. Contact a dietitian for more information. Summary  A heart failure eating plan includes changes that limit your intake of sodium and unhealthy fat, and it may help you lose weight or maintain a healthy weight. Your health care provider may also recommend limiting how much fluid you drink.  Most people with heart failure should eat no more than 2,300 mg of salt (sodium) a day. The amount of sodium that is recommended for you may be lower, depending on your condition.  Contact your health care provider or dietitian before making any major changes to your diet. This information is not intended to replace advice given to you by your health care provider. Make sure you discuss any questions you have with your health care provider. Document Revised:   06/17/2018 Document Reviewed: 09/05/2016 Elsevier Patient Education  2020 [employer]. Heart Failure, Diagnosis  Heart failure means that your heart is not able to pump blood in the right way. This makes it hard for your body to work well. Heart failure is usually a long-term (chronic) condition. You must take good care of yourself and follow your treatment plan from your doctor. What are the causes? This condition may be caused by:  High blood pressure.  Build up of cholesterol and fat in the arteries.  Heart attack. This injures the heart muscle.  Heart valves that do not open and close properly.  Damage of the heart muscle. This is also called cardiomyopathy.  Lung disease.  Abnormal heart rhythms. What increases the risk? The risk of heart failure goes up as a person ages. This condition is also more likely to develop in people who:  Are overweight.  Are male.  Smoke or chew tobacco.  Abuse alcohol or illegal drugs.  Have taken medicines that can damage the heart.  Have diabetes.  Have abnormal  heart rhythms.  Have thyroid problems.  Have low blood counts (anemia). What are the signs or symptoms? Symptoms of this condition include:  Shortness of breath.  Coughing.  Swelling of the feet, ankles, legs, or belly.  Losing weight for no reason.  Trouble breathing.  Waking from sleep because of the need to sit up and get more air.  Rapid heartbeat.  Being very tired.  Feeling dizzy, or feeling like you may pass out (faint).  Having no desire to eat.  Feeling like you may vomit (nauseous).  Peeing (urinating) more at night.  Feeling confused. How is this treated?     This condition may be treated with:  Medicines. These can be given to treat blood pressure and to make the heart muscles stronger.  Changes in your daily life. These may include eating a healthy diet, staying at a healthy body weight, quitting tobacco and illegal drug use, or doing exercises.  Surgery. Surgery can be done to open blocked valves, or to put devices in the heart, such as pacemakers.  A donor heart (heart transplant). You will receive a healthy heart from a donor. Follow these instructions at home:  Treat other conditions as told by your doctor. These may include high blood pressure, diabetes, thyroid disease, or abnormal heart rhythms.  Learn as much as you can about heart failure.  Get support as you need it.  Keep all follow-up visits as told by your doctor. This is important. Summary  Heart failure means that your heart is not able to pump blood in the right way.  This condition is caused by high blood pressure, heart attack, or damage of the heart muscle.  Symptoms of this condition include shortness of breath and swelling of the feet, ankles, legs, or belly. You may also feel very tired or feel like you may vomit.  You may be treated with medicines, surgery, or changes in your daily life.  Treat other health conditions as told by your doctor. This information is not  intended to replace advice given to you by your health care provider. Make sure you discuss any questions you have with your health care provider. Document Revised: 07/09/2018 Document Reviewed: 07/09/2018 Elsevier Patient Education  2020 [employer] Inc[employer]  

## 2019-07-23 NOTE — Progress Notes (Signed)
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  Your patient has had a ReDS reading and the reading has been transmitted to the cloud.   Thank You   The research team  Danae Orleans, PharmD, [facility] PGY2 Cardiology Pharmacy Resident Phone [phone] 07/23/2019       7:04 AM  Please check [email or web address] for unit-specific pharmacist phone numbers

## 2019-07-23 NOTE — Discharge Summary (Signed)
Physician Discharge Summary  Patrick Weber MWN:[id number] DOB: 04-28-1946 DOA: 07/21/2019  PCP: No primary care provider on file.  Admit date: 07/21/2019 Discharge date: 07/23/2019  Admitted From: Home Disposition: Home  Recommendations for Outpatient Follow-up:  1. Follow up with PCP in 1-2 weeks 2. Cardiology in 2-3 weeks following hospitalization 3. Please obtain BMP/CBC in one week 4. Will need repeat echocardiogram 3 months 5. Consider transition ARB to [facility] outpatient  Home Health: No Equipment/Devices: None  Discharge Condition: Stable CODE STATUS: Full code Diet recommendation: Heart healthy diet  History of present illness:  Patrick Weber is a 74 year old gentleman with history of STEMI status post stenting May 2018, ischemic cardiomyopathy with EF 45-50% October 2018, thoracic aortic aneurysm, essential hypertension who presented to the ED with sudden onset chest pain and shortness of breath.  Patient reports symptoms started night prior around 9 PM when he was awakened by the chest pressure and dyspnea.  Patient took 2 sublingual nitroglycerin; in which patient remained symptomatic and his son brought him to the ED for further evaluation.  He denies any fever, no chills, no productive cough, no abdominal pain no nausea/vomiting or diarrhea.  In the ER patient still had persistent chest pain EKG shows normal sinus rhythm chest x-ray shows congestion consistent with pulmonary edema. Chest pain resolved after patient was placed on nitroglycerin patch. Labs are largely unremarkable except for BNP of 645 Covid test is pending. Patient was given Lasix 40 mg IV admitted for acute on chronic systolic heart failure and chest pain concerning for angina.  Hospital course:  Acute on chronic systolic congestive heart failure; 20-25% TTE 3/18 with notable drop in LVEF from  40-45% to 20-25% with global hypokinesis. Etiology likely 2/2 worsening ischemic cardiomyopathy with drop in  EF to 20-25%.  BNP 645 with chest x-ray findings consistent with CHF exacerbation with pulmonary edema.  Initially started on IV diuresis with excellent urine output, of 3.3 L and transition to furosemide 40 mg p.o. daily.  Will discharge home metoprolol succinate 50 mg p.o. daily, losartan 50 mg p.o. daily, furosemide 40 mg p.o. daily, and continue aspirin and statin.  Will not start spironolactone at this time due to borderline systolic blood pressure. Will need reevaluation of LVEF 3 months after revascularization and optimization of medical therapy; for assessment of ICD placement if EF remains less than 35%.  Cardiology to arrange TOC follow-up at [facility] clinic.  Consider transitioning ARB to [facility]; will defer to cardiology at next visit.  Chest pain with Hx CAD and STEMI 2018 Patient presenting with acute onset chest discomfort associate with shortness of breath.  Troponin 8-->7.  Repeat TTE 3/18 notable drop in EF from 40-45% to 20-25%.  Left heart catheterization 07/22/2019 with high-grade/ulcerated plaque proximal and mid LAD status post PCI with balloon angio plasty and DES to proximal/mid LAD.  Continue dual antiplatelet therapy with aspirin 81 mg daily and Brilinta 90 mg p.o. twice daily.  On high intensity atorvastatin 80 mg p.o. daily.  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 Need yearly monitoring aorta via ultrasound.  HLD: Continue atorvastatin 80 mg p.o. daily  Discharge Diagnoses:  Principal Problem:   Acute CHF (congestive heart failure) (HCC) Active Problems:   Coronary artery disease involving native coronary artery of native heart without angina pectoris   Thoracic aortic aneurysm without rupture (HCC)   Acute on chronic systolic CHF (congestive heart failure) (HCC)  Discharge Instructions  Discharge Instructions    (HEART FAILURE PATIENTS) Call MD:  Anytime you have any of the  following symptoms: 1) 3 pound weight gain in 24 hours or 5 pounds in 1 week 2) shortness of breath, with or without a dry hacking cough 3) swelling in the hands, feet or stomach 4) if you have to sleep on extra pillows at night in order to breathe.   Complete by: As directed    Amb Referral to Cardiac Rehabilitation   Complete by: As directed    Diagnosis: Coronary Stents   After initial evaluation and assessments completed: Virtual Based Care may be provided alone or in conjunction with Phase 2 Cardiac Rehab based on patient barriers.: Yes   Call MD for:  difficulty breathing, headache or visual disturbances   Complete by: As directed    Call MD for:  extreme fatigue   Complete by: As directed    Call MD for:  persistant dizziness or light-headedness   Complete by: As directed    Call MD for:  persistant nausea and vomiting   Complete by: As directed    Call MD for:  severe uncontrolled pain   Complete by: As directed    Call MD for:  temperature >100.4   Complete by: As directed    Diet - low sodium heart healthy   Complete by: As directed    Increase activity slowly   Complete by: As directed      Allergies as of 07/23/2019      Reactions   Lidocaine Other (See Comments)   Makes him sleepy.      Medication List    STOP taking these medications   metoprolol tartrate 25 MG tablet Commonly known as: LOPRESSOR     TAKE these medications   aspirin 81 MG EC tablet Take 1 tablet (81 mg total) by mouth daily.   atorvastatin 80 MG tablet Commonly known as: LIPITOR Take 1 tablet (80 mg total) by mouth daily.   ezetimibe 10 MG tablet Commonly known as: ZETIA Take 1 tablet (10 mg total) by mouth daily.   furosemide 40 MG tablet Commonly known as: LASIX Take 1 tablet (40 mg total) by mouth daily. Start taking on: July 24, 2019   losartan 50 MG tablet Commonly known as: COZAAR Take 1 tablet (50 mg total) by mouth daily.   metoprolol succinate 50 MG 24 hr  tablet Commonly known as: TOPROL-XL Take 1 tablet (50 mg total) by mouth daily. Start taking on: July 24, 2019   nitroGLYCERIN 0.4 MG SL tablet Commonly known as: NITROSTAT Place 1 tablet (0.4 mg total) under the tongue every 5 (five) minutes x 3 doses as needed for chest pain.   ticagrelor 90 MG Tabs tablet Commonly known as: BRILINTA Take 1 tablet (90 mg total) by mouth 2 (two) times daily. What changed:   medication strength  how much to take      Follow-up Information    End, Cristal Deer, MD. Schedule an appointment as soon as possible for a visit in 1 week(s).   Specialty: Cardiology Contact information: [address] [place] [address] [phone]        [name], MD. Schedule an appointment as soon as possible for a visit in 1 week(s).   Specialties: Cardiology, Radiology Contact information: [address] [place] [place] [address] [phone]          Allergies  Allergen Reactions  . Lidocaine Other (  See Comments)    Makes him sleepy.    Consultations:  Cardiology - Dr. Keene Breath, Dr. Fletcher Anon, Dr. Margaretann Loveless   Procedures/Studies: DG Chest 2 View  Result Date: 07/21/2019 CLINICAL DATA:  74 year old male with chest pain. EXAM: CHEST - 2 VIEW COMPARISON:  Chest radiograph dated 09/18/2016 and CT dated 10/01/2018 FINDINGS: Mild diffuse interstitial prominence, may be chronic or represent mild edema. Atypical pneumonia is not excluded. Clinical correlation is recommended. No focal consolidation, pleural effusion, pneumothorax. Stable cardiomegaly. No acute osseous pathology. Degenerative changes of the spine. IMPRESSION: Stable cardiomegaly with probable mild interstitial edema. Electronically Signed   By: Anner Crete M.D.   On: 07/21/2019 01:28   CARDIAC CATHETERIZATION  Result Date: 07/22/2019  LM lesion is 20% stenosed.  Ost 1st Diag lesion is 30% stenosed.  Ost 3rd Diag lesion is 60% stenosed.  Prox LAD to Mid  LAD lesion is 85% stenosed.  Ramus lesion is 15% stenosed.  Ost RCA lesion is 30% stenosed.  Previously placed Dist RCA drug eluting stent is widely patentwidely patent.  Prox RCA to Mid RCA lesion is 10% stenosed.  Mid RCA lesion is 50% stenosed.  1st Diag lesion is 80% stenosed.  2nd Diag lesion is 90% stenosed.  There is severe left ventricular systolic dysfunction.  LV end diastolic pressure is normal.  Post intervention, there is a 20% residual stenosis.  Balloon angioplasty was performed using a BALLOON SAPPHIRE 2.5X12.  Post intervention, there is a 30% residual stenosis.  Balloon angioplasty was performed using a BALLOON SAPPHIRE 2.5X12.  Post intervention, there is a 0% residual stenosis.  A drug-eluting stent was successfully placed using a STENT RESOLUTE ONYX 3.0X34.  1.  Significant underlying two-vessel coronary artery disease with widely patent RCA stent with no significant restenosis.  There is moderate mid RCA stenosis just distal to the previously placed stent.  This does not appear to be flow-limiting.  Progression of complex moderately calcified proximal to mid LAD disease involving 2 large diagonals which had significant ostial stenosis. 2.  Severely reduced LV systolic function with an EF of 25%.  Normal LVEDP at 8 mmHg. 3.  Successful complex bifurcation PCI with balloon angioplasty of 2 ostial diagonal branches followed by drug-eluting stent placement to the proximal/mid LAD with excellent results. 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  Recommend aggressive medical therapy. Continue dual antiplatelet therapy for at least 12 months and preferably indefinitely. I switched metoprolol tartrate to metoprolol succinate given cardiomyopathy. If blood pressure allows, consider switching losartan to Entresto and adding spironolactone. The patient does not appear to be volume overloaded and I switch him from IV to oral furosemide.  The burden of PVCs is concerning and might be contributing to his cardiomyopathy.  Consider EP evaluation.   ECHOCARDIOGRAM COMPLETE  Result Date: 07/21/2019    ECHOCARDIOGRAM REPORT   Patient Name:   Patrick Weber Date of Exam: 07/21/2019 Medical Rec #:  [id number]     Height:       72.0 in Accession #:    [id number]    Weight:       185.0 lb Date of Birth:  1946/04/29     BSA:          2.061 m Patient Age:    37 years      BP:           119/86 mmHg Patient Gender: M  HR:           99 bpm. Exam Location:  Inpatient Procedure: 2D Echo, Color Doppler and Cardiac Doppler Indications:    I50.21 Acute systolic (congestive) heart failure  History:        Patient has prior history of Echocardiogram examinations, most                 recent 02/13/2017. CHF, CAD; Risk Factors:Hypertension and                 Dyslipidemia.  Sonographer:    Irving BurtonEmily Senior RDCS Referring Phys: [phone] JEHANZEB MEMON IMPRESSIONS  1. Left ventricular ejection fraction, by estimation, is 20 to 25%. The left ventricle has severely decreased function. The left ventricle demonstrates global hypokinesis. The left ventricular internal cavity size was mildly to moderately dilated. Left ventricular diastolic function could not be evaluated. Left ventricular diastolic function could not be evaluated.  2. Right ventricular systolic function is normal. The right ventricular size is normal. Tricuspid regurgitation signal is inadequate for assessing PA pressure.  3. Left atrial size was severely dilated.  4. The mitral valve is grossly normal. Mild to moderate mitral valve regurgitation. No evidence of mitral stenosis.  5. The aortic valve is tricuspid. Aortic valve regurgitation is mild. No aortic stenosis is present.  6. 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 Aortic dilatation noted. Aneurysm of the ascending aorta, measuring 48 mm.  7. The inferior vena cava is dilated in size with <50% respiratory variability, suggesting right atrial pressure of 15  mmHg. Comparison(s): A prior study was performed on 02/13/2017. Prior images unable to be directly viewed, comparison made by report only. Changes from prior study are noted. The left ventricular function is significantly worse. EF now severely reduced. Ascending aortic aneurysm similar to last CTA. FINDINGS  Left Ventricle: Left ventricular ejection fraction, by estimation, is 20 to 25%. The left ventricle has severely decreased function. The left ventricle demonstrates global hypokinesis. The left ventricular internal cavity size was mildly to moderately dilated. There is no left ventricular hypertrophy. Left ventricular diastolic function could not be evaluated due to atrial fibrillation. Left ventricular diastolic function could not be evaluated. Right Ventricle: The right ventricular size is normal. No increase in right ventricular wall thickness. Right ventricular systolic function is normal. Tricuspid regurgitation signal is inadequate for assessing PA pressure. Left Atrium: Left atrial size was severely dilated. Right Atrium: Right atrial size was normal in size. Pericardium: There is no evidence of pericardial effusion. Mitral Valve: The mitral valve is grossly normal. Mild to moderate mitral valve regurgitation. No evidence of mitral valve stenosis. Tricuspid Valve: The tricuspid valve is grossly normal. Tricuspid valve regurgitation is trivial. No evidence of tricuspid stenosis. Aortic Valve: The aortic valve is tricuspid. Aortic valve regurgitation is mild. No aortic stenosis is present. Pulmonic Valve: The pulmonic valve was grossly normal. Pulmonic valve regurgitation is not visualized. No evidence of pulmonic stenosis. 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 Aortic dilatation noted. There is an aneurysm involving the ascending aorta. The aneurysm measures 48 mm. Venous: The inferior vena cava is dilated in size with less than 50% respiratory variability, suggesting right atrial pressure of 15 mmHg.  IAS/Shunts: No atrial level shunt detected by color flow Doppler. EKG: Rhythm strip during this exam demostrated atrial fibrillation.  LEFT VENTRICLE PLAX 2D LVIDd:         6.40 cm LVIDs:         5.60 cm LV PW:  1.00 cm LV IVS:        1.00 cm LVOT diam:     2.50 cm LV SV:         58 LV SV Index:   28 LVOT Area:     4.91 cm  LV Volumes (MOD) LV vol d, MOD A2C: 159.0 ml LV vol d, MOD A4C: 223.0 ml LV vol s, MOD A2C: 119.0 ml LV vol s, MOD A4C: 136.0 ml LV SV MOD A2C:     40.0 ml LV SV MOD A4C:     223.0 ml LV SV MOD BP:      60.0 ml RIGHT VENTRICLE RV S prime:     12.30 cm/s TAPSE (M-mode): 1.8 cm LEFT ATRIUM              Index       RIGHT ATRIUM           Index LA diam:        3.80 cm  1.84 cm/m  RA Area:     20.50 cm LA Vol (A2C):   116.0 ml 56.28 ml/m RA Volume:   58.10 ml  28.19 ml/m LA Vol (A4C):   79.2 ml  38.43 ml/m LA Biplane Vol: 107.0 ml 51.91 ml/m  AORTIC VALVE LVOT Vmax:   68.70 cm/s LVOT Vmean:  47.400 cm/s LVOT VTI:    0.118 m  AORTA Ao Root diam: 4.60 cm Ao Asc diam:  4.80 cm  SHUNTS Systemic VTI:  0.12 m Systemic Diam: 2.50 cm Lennie Odor MD Electronically signed by Lennie Odor MD Signature Date/Time: 07/21/2019/2:46:58 PM    Final       Subjective: Patient seen and examined bedside, resting comfortably.  Continues without chest discomfort or shortness of breath.  Ready for discharge home.  Denies headache, no fever/chills/night sweats, no nausea/vomiting/diarrhea, no chest pain, palpitations, no shortness of breath, no abdominal pain, no weakness, no fatigue, no paresthesias.  No acute events overnight per nursing staff.   Discharge Exam: Vitals:   07/22/19 2300 07/23/19 0607  BP:  106/64  Pulse:  83  Resp:  19  Temp:  98.1 F (36.7 C)  SpO2: 96% 99%   Vitals:   07/22/19 1351 07/22/19 1950 07/22/19 2300 07/23/19 0607  BP: 107/83 104/62  106/64  Pulse: 87 80  83  Resp:  19  19  Temp: 97.6 F (36.4 C) 99 F (37.2 C)  98.1 F (36.7 C)  TempSrc: Oral Oral  Oral   SpO2: 98% 97% 96% 99%  Weight:      Height:        General: Pt is alert, awake, not in acute distress Cardiovascular: RRR, S1/S2 +, no rubs, no gallops Respiratory: CTA bilaterally, no wheezing, no rhonchi Abdominal: Soft, NT, ND, bowel sounds + Extremities: no edema, no cyanosis    The results of significant diagnostics from this hospitalization (including imaging, microbiology, ancillary and laboratory) are listed below for reference.     Microbiology: Recent Results (from the past 240 hour(s))  SARS CORONAVIRUS 2 (TAT 6-24 HRS) Nasopharyngeal Nasopharyngeal Swab     Status: None   Collection Time: 07/21/19  4:00 AM   Specimen: Nasopharyngeal Swab  Result Value Ref Range Status   SARS Coronavirus 2 NEGATIVE NEGATIVE Final    Comment: (NOTE) SARS-CoV-2 target nucleic acids are NOT DETECTED. The SARS-CoV-2 RNA is generally detectable in upper and lower respiratory specimens during the acute phase of infection. Negative results do not preclude SARS-CoV-2 infection, do not  rule out co-infections with other pathogens, and should not be used as the sole basis for treatment or other patient management decisions. Negative results must be combined with clinical observations, patient history, and epidemiological information. The expected result is Negative. Fact Sheet for Patients: HairSlick.no Fact Sheet for Healthcare Providers: quierodirigir.com This test is not yet approved or cleared by the [place] FDA and  has been authorized for detection and/or diagnosis of SARS-CoV-2 by FDA under an Emergency Use Authorization (EUA). This EUA will remain  in effect (meaning this test can be used) for the duration of the COVID-19 declaration under Section 56 4(b)(1) of the Act, 21 U.S.C. section 360bbb-3(b)(1), unless the authorization is terminated or revoked sooner. Performed at [facility], [address]  [place], [place] [address]      Labs: BNP (last 3 results) Recent Labs    07/21/19 0328  BNP 645.2*   Basic Metabolic Panel: Recent Labs  Lab 07/21/19 0107 07/21/19 0654 07/22/19 0443 07/23/19 0334  NA 141 142 143 140  K 3.8 3.7 3.5 3.9  CL 106 106 105 106  CO2 23 24 25 24   GLUCOSE 99 101* 95 97  BUN 16 13 16 18   CREATININE 0.93 1.00 1.04 1.02  CALCIUM 8.7* 9.0 9.1 8.7*  MG  --  2.0  --   --    Liver Function Tests: No results for input(s): AST, ALT, ALKPHOS, BILITOT, PROT, ALBUMIN in the last 168 hours. No results for input(s): LIPASE, AMYLASE in the last 168 hours. No results for input(s): AMMONIA in the last 168 hours. CBC: Recent Labs  Lab 07/21/19 0107 07/21/19 0654 07/23/19 0334  WBC 6.4 6.5 7.0  HGB 14.2 15.0 14.5  HCT 41.2 44.6 41.6  MCV 90.7 91.6 89.3  PLT 182 196 162   Cardiac Enzymes: No results for input(s): CKTOTAL, CKMB, CKMBINDEX, TROPONINI in the last 168 hours. BNP: Invalid input(s): POCBNP CBG: No results for input(s): GLUCAP in the last 168 hours. D-Dimer No results for input(s): DDIMER in the last 72 hours. Hgb A1c No results for input(s): HGBA1C in the last 72 hours. Lipid Profile No results for input(s): CHOL, HDL, LDLCALC, TRIG, CHOLHDL, LDLDIRECT in the last 72 hours. Thyroid function studies No results for input(s): TSH, T4TOTAL, T3FREE, THYROIDAB in the last 72 hours.  Invalid input(s): FREET3 Anemia work up No results for input(s): VITAMINB12, FOLATE, FERRITIN, TIBC, IRON, RETICCTPCT in the last 72 hours. Urinalysis No results found for: COLORURINE, APPEARANCEUR, LABSPEC, PHURINE, GLUCOSEU, HGBUR, BILIRUBINUR, KETONESUR, PROTEINUR, UROBILINOGEN, NITRITE, LEUKOCYTESUR Sepsis Labs Invalid input(s): PROCALCITONIN,  WBC,  LACTICIDVEN Microbiology Recent Results (from the past 240 hour(s))  SARS CORONAVIRUS 2 (TAT 6-24 HRS) Nasopharyngeal Nasopharyngeal Swab     Status: None   Collection Time: 07/21/19  4:00 AM   Specimen:  Nasopharyngeal Swab  Result Value Ref Range Status   SARS Coronavirus 2 NEGATIVE NEGATIVE Final    Comment: (NOTE) SARS-CoV-2 target nucleic acids are NOT DETECTED. The SARS-CoV-2 RNA is generally detectable in upper and lower respiratory specimens during the acute phase of infection. Negative results do not preclude SARS-CoV-2 infection, do not rule out co-infections with other pathogens, and should not be used as the sole basis for treatment or other patient management decisions. Negative results must be combined with clinical observations, patient history, and epidemiological information. The expected result is Negative. Fact Sheet for Patients: 07/25/19 Fact Sheet for Healthcare Providers: 07/23/19 This test is not yet approved or cleared by the [employer].no  FDA and  has been authorized for detection and/or diagnosis of SARS-CoV-2 by FDA under an Emergency Use Authorization (EUA). This EUA will remain  in effect (meaning this test can be used) for the duration of the COVID-19 declaration under Section 56 4(b)(1) of the Act, 21 U.S.C. section 360bbb-3(b)(1), unless the authorization is terminated or revoked sooner. Performed at [facility], [address] [place], [place] [address]      Time coordinating discharge: Over 30 minutes  SIGNED:   Alvira Philips [place], DO  [facility] 07/23/2019, 2:20 PM

## 2019-07-23 NOTE — Progress Notes (Signed)
Progress Note  Patient Name: Patrick Weber Date of Encounter: 07/23/2019  Primary Cardiologist: Nelva Bush, MD   Subjective   Feels well, ready to go home  Inpatient Medications    Scheduled Meds: . aspirin EC  81 mg Oral Daily  . atorvastatin  80 mg Oral q1800  . enoxaparin (LOVENOX) injection  40 mg Subcutaneous Q24H  . ezetimibe  10 mg Oral Daily  . furosemide  40 mg Oral Daily  . losartan  50 mg Oral Daily  . metoprolol succinate  50 mg Oral Daily  . nitroGLYCERIN  0.4 mg Transdermal Daily  . sodium chloride flush  3 mL Intravenous Once  . sodium chloride flush  3 mL Intravenous Q12H  . sodium chloride flush  3 mL Intravenous Q12H  . ticagrelor  90 mg Oral BID   Continuous Infusions: . sodium chloride     PRN Meds: sodium chloride, acetaminophen **OR** acetaminophen, nitroGLYCERIN, ondansetron **OR** ondansetron (ZOFRAN) IV, sodium chloride flush   Vital Signs    Vitals:   07/22/19 1351 07/22/19 1950 07/22/19 2300 07/23/19 0607  BP: 107/83 104/62  106/64  Pulse: 87 80  83  Resp:  19  19  Temp: 97.6 F (36.4 C) 99 F (37.2 C)  98.1 F (36.7 C)  TempSrc: Oral Oral  Oral  SpO2: 98% 97% 96% 99%  Weight:      Height:        Intake/Output Summary (Last 24 hours) at 07/23/2019 1301 Last data filed at 07/22/2019 2000 Gross per 24 hour  Intake 330 ml  Output 200 ml  Net 130 ml   Last 3 Weights 07/22/2019 07/21/2019 07/21/2019  Weight (lbs) 173 lb 1.6 oz 175 lb 9.6 oz 185 lb  Weight (kg) 78.518 kg 79.652 kg 83.915 kg      Telemetry    Normal sinus rhythm- Personally Reviewed  ECG    No new tracing - Personally Reviewed  Physical Exam   Constitutional: No acute distress Eyes: sclera non-icteric, normal conjunctiva and lids ENMT:  moist mucous membranes Cardiovascular: regular rhythm, normal rate, no murmurs. S1 and S2 normal. Radial pulses normal bilaterally. No jugular venous distention.  Respiratory: clear to auscultation bilaterally GI :  normal bowel sounds, soft and nontender. No distention.   MSK: extremities warm, well perfused. No edema.  NEURO: grossly nonfocal exam, moves all extremities. PSYCH: alert and oriented x 3, normal mood and affect.   Labs    High Sensitivity Troponin:   Recent Labs  Lab 07/21/19 0107 07/21/19 0328 07/21/19 0654  TROPONINIHS 9 8 7       Chemistry Recent Labs  Lab 07/21/19 0654 07/22/19 0443 07/23/19 0334  NA 142 143 140  K 3.7 3.5 3.9  CL 106 105 106  CO2 24 25 24   GLUCOSE 101* 95 97  BUN 13 16 18   CREATININE 1.00 1.04 1.02  CALCIUM 9.0 9.1 8.7*  GFRNONAA >60 >60 >60  GFRAA >60 >60 >60  ANIONGAP 12 13 10      Hematology Recent Labs  Lab 07/21/19 0107 07/21/19 0654 07/23/19 0334  WBC 6.4 6.5 7.0  RBC 4.54 4.87 4.66  HGB 14.2 15.0 14.5  HCT 41.2 44.6 41.6  MCV 90.7 91.6 89.3  MCH 31.3 30.8 31.1  MCHC 34.5 33.6 34.9  RDW 13.0 13.2 12.9  PLT 182 196 162    BNP Recent Labs  Lab 07/21/19 0328  BNP 645.2*     DDimer No results for input(s): DDIMER in the last  168 hours.   Radiology    CARDIAC CATHETERIZATION  Result Date: 07/22/2019  LM lesion is 20% stenosed.  Ost 1st Diag lesion is 30% stenosed.  Ost 3rd Diag lesion is 60% stenosed.  Prox LAD to Mid LAD lesion is 85% stenosed.  Ramus lesion is 15% stenosed.  Ost RCA lesion is 30% stenosed.  Previously placed Dist RCA drug eluting stent is widely patentwidely patent.  Prox RCA to Mid RCA lesion is 10% stenosed.  Mid RCA lesion is 50% stenosed.  1st Diag lesion is 80% stenosed.  2nd Diag lesion is 90% stenosed.  There is severe left ventricular systolic dysfunction.  LV end diastolic pressure is normal.  Post intervention, there is a 20% residual stenosis.  Balloon angioplasty was performed using a BALLOON SAPPHIRE 2.5X12.  Post intervention, there is a 30% residual stenosis.  Balloon angioplasty was performed using a BALLOON SAPPHIRE 2.5X12.  Post intervention, there is a 0% residual stenosis.   A drug-eluting stent was successfully placed using a STENT RESOLUTE ONYX 3.0X34.  1.  Significant underlying two-vessel coronary artery disease with widely patent RCA stent with no significant restenosis.  There is moderate mid RCA stenosis just distal to the previously placed stent.  This does not appear to be flow-limiting.  Progression of complex moderately calcified proximal to mid LAD disease involving 2 large diagonals which had significant ostial stenosis. 2.  Severely reduced LV systolic function with an EF of 25%.  Normal LVEDP at 8 mmHg. 3.  Successful complex bifurcation PCI with balloon angioplasty of 2 ostial diagonal branches followed by drug-eluting stent placement to the proximal/mid LAD with excellent results. 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  Recommend aggressive medical therapy. Continue dual antiplatelet therapy for at least 12 months and preferably indefinitely. I switched metoprolol tartrate to metoprolol succinate given cardiomyopathy. If blood pressure allows, consider switching losartan to Entresto and adding spironolactone. The patient does not appear to be volume overloaded and I switch him from IV to oral furosemide. The burden of PVCs is concerning and might be contributing to his cardiomyopathy.  Consider EP evaluation.    Cardiac Studies   Echocardiogram 07/21/2019  1. Left ventricular ejection fraction, by estimation, is 20 to 25%. The  left ventricle has severely decreased function. The left ventricle  demonstrates global hypokinesis. The left ventricular internal cavity size  was mildly to moderately dilated. Left  ventricular diastolic function could not be evaluated. Left ventricular  diastolic function could not be evaluated.  2. Right ventricular systolic function is normal. The right ventricular  size is normal. Tricuspid regurgitation signal is inadequate for assessing  PA pressure.  3. Left  atrial size was severely dilated.  4. The mitral valve is grossly normal. Mild to moderate mitral valve  regurgitation. No evidence of mitral stenosis.  5. The aortic valve is tricuspid. Aortic valve regurgitation is mild. No  aortic stenosis is present.  6. 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 Aortic dilatation noted. Aneurysm of  the ascending aorta, measuring 48 mm.  7. The inferior vena cava is dilated in size with <50% respiratory  variability, suggesting right atrial pressure of 15 mmHg.   Patient Profile     74 y.o. male with CAD status post inferior STEMI and stents to the RCA 2019, mild-moderate MR, mild-moderate AI due to moderate aneurysm of the ascending aorta (48 mm), previously with mildly depressed left ventricular systolic function, presents with PND, angina at rest  and abrupt and severe reduction in LV ejection fraction (now 20-25% with global hypokinesis).  Assessment & Plan   1. CAD: severe global left ventricular dysfunction despite the absence of an increase in cardiac markers suggest extensive myocardial stunning, possibly due to ulcerated plaque in the LAD artery with transient severe ischemia.  PCI performed successfully  We will continue dual antiplatelet therapy and current dose of statin.  LDL is in target range. 2. CHF: LVEDP is normal at the time of cardiac catheterization, following approximately 3 L diuresis.  It is also likely that the LVEDP has improved due to recovery of some degree of myocardial stunning.  Need to continue treatment with ARB and beta-blocker and maximum tolerated doses.  Reevaluate LVEF 3 months after revascularization procedure and optimization of medical therapy.  To discuss ICD if LVEF remains less than 35%. 3. Thoracic aortic aneurysm: Monitor with yearly imaging. 4. AI: Mild-moderate, unlikely to be a cause of reduction in LV function. 5. MR: Mild-moderate, centrally directed jet, consistent with secondary MR     Patient is ready for  discharge from cardiovascular standpoint. Will arrange TOC follow up.  [facility] will sign off.   Medication Recommendations:   Continue ASA 81 mg daily Atorvastatin 80 mg daily Zetia 10 mg daily Lasix 40 mg po daily Losartan 50 mg daily Metoprolol succinate 50 mg daily Ticagrelor (Brilinta) 90 mg Bid.  Other recommendations (labs, testing, etc):  Echo in 3 mo Follow up as an outpatient:  TOC at [facility] per patient preference after Dr. Okey Dupre has transitioned to [facility]. Dr. Royann Shivers or Dr. Jacques Navy.     Signed, [name], MD  07/23/2019, 1:01 PM

## 2019-07-23 NOTE — Progress Notes (Signed)
CARDIAC REHAB PHASE I   PRE:  Rate/Rhythm: 87 SR PVCs  BP:  Supine:   Sitting: 113/84  Standing:    SaO2: 97%RA  MODE:  Ambulation: 470 ft   POST:  Rate/Rhythm: 108 ST  BP:  Supine:   Sitting: 116/84  Standing:    SaO2: 98%RA 0745-0835 Pt walked 470 ft on RA with steady gait and no CP. Tolerated well with less PVCs with activity. Gave pt CHF booklet due to low EF and discussed signs/symtoms of CHF, importance of daily weights and 2000 mg sodium restriction. Gave heart healthy and low sodium diets. Discussed ex ed. Pt has treadmill and recumbent bike. Referred to CRP 2 [place]. Pt stated not good with Apps so did not discuss Virtual App.  Reviewed NTG use and importance of brilinta  with stent.  Pt voiced understanding of ed.   Patrick Nutting, RN BSN  07/23/2019 8:33 AM

## 2019-07-25 ENCOUNTER — Telehealth: Payer: Self-pay | Admitting: Internal Medicine

## 2019-07-25 NOTE — Telephone Encounter (Signed)
Patient is requesting to switch providers from Dr. Okey Dupre to Dr. Jacques Navy due to location.

## 2019-07-25 NOTE — Telephone Encounter (Signed)
That is fine with me.  Nohely Whitehorn, MD [facility]  

## 2019-07-26 NOTE — Progress Notes (Addendum)
Cardiology Clinic Note   Patient Name: Patrick Weber Date of Encounter: 07/28/2019  Primary Care Provider:  Patient, No Pcp Per Primary Cardiologist:  [name], MD  Patient Profile    Patrick Weber 74 year old male presents today for follow-up of his acute on chronic systolic heart failure, ICM, essential hypertension, and hyperlipidemia.  Past Medical History    Past Medical History:  Diagnosis Date  . Acute ST elevation myocardial infarction (STEMI) of inferior wall (HCC) 09/18/2016  . 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   . Hyperlipemia    Past Surgical History:  Procedure Laterality Date  . CORONARY BALLOON ANGIOPLASTY N/A 07/22/2019   Procedure: CORONARY BALLOON ANGIOPLASTY;  Surgeon: [name], MD;  Location: [facility] INVASIVE CV LAB;  Service: Cardiovascular;  Laterality: N/A;  . CORONARY STENT INTERVENTION N/A 07/22/2019   Procedure: CORONARY STENT INTERVENTION;  Surgeon: [name], MD;  Location: [facility] INVASIVE CV LAB;  Service: Cardiovascular;  Laterality: N/A;  . LEFT HEART CATH AND CORONARY ANGIOGRAPHY N/A 09/18/2016   Procedure: Left Heart Cath and Coronary Angiography;  Surgeon: [name], MD;  Location: [facility] INVASIVE CV LAB;  Service: Cardiovascular;  Laterality: N/A;  . LEFT HEART CATH AND CORONARY ANGIOGRAPHY N/A 07/22/2019   Procedure: LEFT HEART CATH AND CORONARY ANGIOGRAPHY;  Surgeon: [name], MD;  Location: [facility] INVASIVE CV LAB;  Service: Cardiovascular;  Laterality: N/A;    Allergies  Allergies  Allergen Reactions  . Lidocaine Other (See Comments)    Makes him sleepy.    History of Present Illness    Patrick Weber has a past medical history of STEMI (s/p stenting 09/2016), ischemic cardiomyopathy EF 45-50% October 18, thoracic aortic aneurysm, and HTN.  He presented to the emergency  department on 07/21/2019 with sudden onset chest pain and shortness of breath.  He indicated that his chest pain started prior night around 9 PM.  He was woken from sleep with chest pressure and dyspnea.  He then proceeded to take 2 sublingual nitroglycerin which did not help with his symptoms.  He then presented to the emergency department for further evaluation.  His EKG showed normal sinus rhythm.  His chest x-ray showed pulmonary congestion consistent with pulmonary edema.  His symptoms resolved after he was given a nitroglycerin patch.  His BNP at that time was 645.  He was given IV diuresis and had an output of 3.3 L.  He was then transition to 40 mg of p.o. furosemide daily.  A TEE 07/21/2019 showed a reduction in his ejection fraction from 40-45% to 20-25%.  He underwent cardiac catheterization on 07/22/2019 which showed proximal and mid LAD high-grade plaque with 85% stenosis.  A successful bifurcation PCI with balloon angioplasty of 2 ostial diagonal branches was performed followed by DES x1 to his proximal/mid LAD.  His previous RCA stents were widely patent.  He was placed on aspirin, Brilinta, and atorvastatin 80 mg daily  He presents the clinic today for follow-up and states he is feeling well.  He has been walking for a mile per day on his treadmill and riding his stationary bike for 15 to 20 minutes 7 days/week.  He has been following a low-sodium diet.  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  I will stop his losartan today and start the initial dose of Entresto, order BMP, and give him salty 6 sheet.  I will have him follow-up in 2 weeks.  Today he denies chest pain, shortness of breath, lower extremity edema, fatigue, palpitations, melena, hematuria, hemoptysis, diaphoresis, weakness, presyncope, syncope, orthopnea, and PND.    Home Medications    Prior to Admission  medications   Medication Sig Start Date End Date Taking? Authorizing Provider  aspirin 81 MG EC tablet Take 1 tablet (81 mg total) by mouth daily. 07/23/19 10/21/19  [place], Alvira Philips, DO  atorvastatin (LIPITOR) 80 MG tablet Take 1 tablet (80 mg total) by mouth daily. 07/23/19 10/21/19  [place], Alvira Philips, DO  ezetimibe (ZETIA) 10 MG tablet Take 1 tablet (10 mg total) by mouth daily. 07/23/19 10/21/19  [place], Alvira Philips, DO  furosemide (LASIX) 40 MG tablet Take 1 tablet (40 mg total) by mouth daily. 07/24/19 10/22/19  [place], Alvira Philips, DO  losartan (COZAAR) 50 MG tablet Take 1 tablet (50 mg total) by mouth daily. 07/23/19 10/21/19  [place], Alvira Philips, DO  metoprolol succinate (TOPROL-XL) 50 MG 24 hr tablet Take 1 tablet (50 mg total) by mouth daily. 07/24/19 10/22/19  [place], Alvira Philips, DO  nitroGLYCERIN (NITROSTAT) 0.4 MG SL tablet Place 1 tablet (0.4 mg total) under the tongue every 5 (five) minutes x 3 doses as needed for chest pain. 09/22/16   Arty Baumgartner, NP  ticagrelor (BRILINTA) 90 MG TABS tablet Take 1 tablet (90 mg total) by mouth 2 (two) times daily. 07/22/19 08/21/19  [place], Eric J, DO    Family History    Family History  Problem Relation Age of Onset  . Heart disease Mother   . Emphysema Father    He indicated that his mother is deceased. He indicated that his father is deceased. He indicated that his sister is alive. He indicated that his brother is alive. He indicated that his maternal grandmother is deceased. He indicated that his maternal grandfather is deceased. He indicated that his paternal grandmother is deceased. He indicated that his paternal grandfather is deceased.  Social History    Social History   Socioeconomic History  . Marital status: Married    Spouse name: Not on file  . Number of children: Not on file  . Years of education: Not on file  . Highest education level: Not on file  Occupational History  . Not on file  Tobacco Use  . Smoking status: Never Smoker  . Smokeless  tobacco: Never Used  Substance and Sexual Activity  . Alcohol use: Not on file  . Drug use: No  . Sexual activity: Not on file  Other Topics Concern  . Not on file  Social History Narrative  . Not on file   Social Determinants of Health   Financial Resource Strain:   . Difficulty of Paying Living Expenses:   Food Insecurity:   . Worried About Programme researcher, broadcasting/film/video in the Last Year:   . Barista in the Last Year:   Transportation Needs:   . Freight forwarder (Medical):   [name] Lack of Transportation (Non-Medical):   Physical Activity:   . Days of Exercise per Week:   . Minutes of Exercise per Session:   Stress:   . Feeling of Stress :   Social Connections:   . Frequency of Communication with Friends and Family:   .  Frequency of Social Gatherings with Friends and Family:   . Attends Religious Services:   . Active Member of Clubs or Organizations:   . Attends Archivist Meetings:   [name] Marital Status:   Intimate Partner Violence:   . Fear of Current or Ex-Partner:   . Emotionally Abused:   [name] Physically Abused:   . Sexually Abused:      Review of Systems    General:  No chills, fever, night sweats or weight changes.  Cardiovascular:  No chest pain, dyspnea on exertion, edema, orthopnea, palpitations, paroxysmal nocturnal dyspnea. Dermatological: No rash, lesions/masses Respiratory: No cough, dyspnea Urologic: No hematuria, dysuria Abdominal:   No nausea, vomiting, diarrhea, bright red blood per rectum, melena, or hematemesis Neurologic:  No visual changes, wkns, changes in mental status. All other systems reviewed and are otherwise negative except as noted above.  Physical Exam    VS:  BP 112/72 (BP Location: Right Arm, Patient Position: Sitting, Cuff Size: Normal)   Pulse 89   Ht 6' (1.829 m)   Wt 180 lb 9.6 oz (81.9 kg)   BMI 24.49 kg/m  , BMI Body mass index is 24.49 kg/m. GEN: Well nourished, well developed, in no acute distress. HEENT:  normal. Neck: Supple, no JVD, carotid bruits, or masses. Cardiac: RRR, no murmurs, rubs, or gallops. No clubbing, cyanosis, edema.  Radials/DP/PT 2+ and equal bilaterally.  Respiratory:  Respirations regular and unlabored, clear to auscultation bilaterally. GI: Soft, nontender, nondistended, BS + x 4. MS: no deformity or atrophy. Skin: warm and dry, no rash.  Right radial cath site clean dry intact no drainage. Neuro:  Strength and sensation are intact. Psych: Normal affect.  Accessory Clinical Findings   EKG 07/28/2019 normal sinus rhythm nonspecific T wave abnormality 75 bpm   Echocardiogram 07/21/2019 IMPRESSIONS    1. Left ventricular ejection fraction, by estimation, is 20 to 25%. The  left ventricle has severely decreased function. The left ventricle  demonstrates global hypokinesis. The left ventricular internal cavity size  was mildly to moderately dilated. Left  ventricular diastolic function could not be evaluated. Left ventricular  diastolic function could not be evaluated.  2. Right ventricular systolic function is normal. The right ventricular  size is normal. Tricuspid regurgitation signal is inadequate for assessing  PA pressure.  3. Left atrial size was severely dilated.  4. The mitral valve is grossly normal. Mild to moderate mitral valve  regurgitation. No evidence of mitral stenosis.  5. The aortic valve is tricuspid. Aortic valve regurgitation is mild. No  aortic stenosis is present.  6. 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 Aortic dilatation noted. Aneurysm of  the ascending aorta, measuring 48 mm.  7. The inferior vena cava is dilated in size with <50% respiratory  variability, suggesting right atrial pressure of 15 mmHg.   Comparison(s): A prior study was performed on 02/13/2017. Prior images  unable to be directly viewed, comparison made by report only. Changes from  prior study are noted. The left ventricular function is significantly  worse. EF now  severely reduced.  Ascending aortic aneurysm similar to last CTA.  Cardiac catheterization 07/22/2019 Significant underlying two-vessel coronary artery disease with widely patent RCA stent with no significant restenosis.  There is moderate mid RCA stenosis just distal to the previously placed stent.  This does not appear to be flow-limiting.  Progression of complex moderately calcified proximal to mid LAD disease involving 2 large diagonals which had significant ostial stenosis. 2.  Severely reduced  LV systolic function with an EF of 25%.  Normal LVEDP at 8 mmHg. 3.  Successful complex bifurcation PCI with balloon angioplasty of 2 ostial diagonal branches followed by drug-eluting stent placement to the proximal/mid LAD with excellent results.  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  Recommend aggressive medical therapy. Continue dual antiplatelet therapy for at least 12 months and preferably indefinitely. I switched metoprolol tartrate to metoprolol succinate given cardiomyopathy. If blood pressure allows, consider switching losartan to Entresto and adding spironolactone. The patient does not appear to be volume overloaded and I switch him from IV to oral furosemide. The burden of PVCs is concerning and might be contributing to his cardiomyopathy.  Consider EP evaluation.  ECG personally reviewed by me today-normal sinus rhythm nonspecific T wave abnormality 75 bpm- No acute changes  Diagnostic Dominance: Right  Intervention     Assessment & Plan   1.  Acute on chronic systolic CHF-euvolemic today.  No increased work of breathing.  Echocardiogram 07/21/2019 showed a decrease in his LVEF from 40-45% to 20-25% with global hypokinesis.  BP today 112/72 Stop losartan Start Entresto 24/26 Continue metoprolol succinate 50 mg daily Continue furosemide 40 mg tablet daily  Heart healthy low-sodium diet Increase physical activity as  tolerated Plan to add spironolactone with next visit if blood pressure allows. Recommend repeat echocardiogram when medications have been optimized for 1 month Order BMP today and in 2 weeks  Coronary artery disease-no chest pain today.  Cardiac catheterization  07/22/2019 which showed proximal and mid LAD high-grade plaque with 85% stenosis.  A successful bifurcation PCI with balloon angioplasty of 2 ostial diagonal branches was performed followed by DES x1 to his proximal/mid LAD.  His previous RCA stents were widely patent. Continue aspirin 81 mg daily Continue atorvastatin 80 mg tablet daily Continue metoprolol succinate 50 mg tablet daily Continue continue Brilinta 90 mg twice daily Heart healthy low-sodium diet Increase physical activity as tolerated Refill sublingual nitroglycerin  Hyperlipidemia-LDL 64 05/10/2018 Continue atorvastatin 80 mg tablet daily Heart healthy low-sodium high-fiber diet Increase physical activity as tolerated   Disposition: Follow-up with me or Dr. Jacques Navy in 2 weeks.  Thomasene Ripple. Amira Podolak NP-C       [facility] [address] Office [phone] Fax [phone]

## 2019-07-27 NOTE — Telephone Encounter (Signed)
I saw him in the hospital and look forward to seeing him in the office. Thanks! GA

## 2019-07-28 ENCOUNTER — Ambulatory Visit: Payer: Medicare HMO | Admitting: General Practice

## 2019-07-28 ENCOUNTER — Other Ambulatory Visit: Payer: Self-pay

## 2019-07-28 ENCOUNTER — Encounter: Payer: Self-pay | Admitting: General Practice

## 2019-07-28 VITALS — BP 112/72 | HR 89 | Ht 72.0 in | Wt 180.6 lb

## 2019-07-28 DIAGNOSIS — I5023 Acute on chronic systolic (congestive) heart failure: Secondary | ICD-10-CM | POA: Diagnosis not present

## 2019-07-28 DIAGNOSIS — I10 Essential (primary) hypertension: Secondary | ICD-10-CM | POA: Diagnosis not present

## 2019-07-28 DIAGNOSIS — I2510 Atherosclerotic heart disease of native coronary artery without angina pectoris: Secondary | ICD-10-CM

## 2019-07-28 DIAGNOSIS — E785 Hyperlipidemia, unspecified: Secondary | ICD-10-CM | POA: Diagnosis not present

## 2019-07-28 LAB — BASIC METABOLIC PANEL
BUN/Creatinine Ratio: 21 (ref 10–24)
BUN: 21 mg/dL (ref 8–27)
CO2: 25 mmol/L (ref 20–29)
Calcium: 9.3 mg/dL (ref 8.6–10.2)
Chloride: 101 mmol/L (ref 96–106)
Creatinine, Ser: 1 mg/dL (ref 0.76–1.27)
GFR calc Af Amer: 87 mL/min/{1.73_m2} (ref >59)
GFR calc non Af Amer: 75 mL/min/{1.73_m2} (ref >59)
Glucose: 75 mg/dL (ref 65–99)
Potassium: 4 mmol/L (ref 3.5–5.2)
Sodium: 142 mmol/L (ref 134–144)

## 2019-07-28 MED ORDER — ENTRESTO 24-26 MG PO TABS: 1.0000 | ORAL_TABLET | Freq: Two times a day (BID) | ORAL | 3 refills | Status: DC

## 2019-07-28 MED ORDER — NITROGLYCERIN 0.4 MG SL SUBL: 0.4000 mg | SUBLINGUAL_TABLET | SUBLINGUAL | 2 refills | Status: AC | PRN

## 2019-07-28 NOTE — Patient Instructions (Signed)
Medication Instructions:  STOP LOSARTAN START ENTRESTO 24/26MG  TWICE DAILY *If you need a refill on your cardiac medications before your next appointment, please call your pharmacy*  Lab Work: BMET TODAY If you have labs (blood work) drawn today and your tests are completely normal, you will receive your results only by:  MyChart Message (if you have MyChart) OR A paper copy in the mail.  If you have any lab test that is abnormal or we need to change your treatment, we will call you to review the results.  Other Instructions TAKE AND LOG YOUR WEIGHT AND BRING LOG TO YOUR FOLLOW UP APPOINTMENT PLEASE READ AND FOLLOW SALTY 6-ATTACHED  Follow-Up: Your next appointment:  2 week(s)  In Person with JESSE CLEAVER,FNP-C  At [facility], you and your health needs are our priority.  As part of our continuing mission to provide you with exceptional heart care, we have created designated Provider Care Teams.  These Care Teams include your primary Cardiologist (physician) and Advanced Practice Providers (APPs -  Physician Assistants and Nurse Practitioners) who all work together to provide you with the care you need, when you need it.  We recommend signing up for the patient portal called "MyChart".  Sign up information is provided on this After Visit Summary.  MyChart is used to connect with patients for Virtual Visits (Telemedicine).  Patients are able to view lab/test results, encounter notes, upcoming appointments, etc.  Non-urgent messages can be sent to your provider as well.   To learn more about what you can do with MyChart, go to [email or web address].

## 2019-08-10 NOTE — Progress Notes (Signed)
Cardiology Clinic Note   Patient Name: Patrick Weber Date of Encounter: 08/11/2019  Primary Care Provider:  Patient, No Pcp Per Primary Cardiologist:  [name], MD  Patient Profile    Patrick Weber 74 year old male presents today for follow-up of his acute on chronic systolic heart failure, ICM, essential hypertension, and hyperlipidemia.  Past Medical History    Past Medical History:  Diagnosis Date  . Acute ST elevation myocardial infarction (STEMI) of inferior wall (HCC) 09/18/2016  . 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   . Hyperlipemia    Past Surgical History:  Procedure Laterality Date  . CORONARY BALLOON ANGIOPLASTY N/A 07/22/2019   Procedure: CORONARY BALLOON ANGIOPLASTY;  Surgeon: [name], MD;  Location: [facility];  Service: Cardiovascular;  Laterality: N/A;  . CORONARY STENT INTERVENTION N/A 07/22/2019   Procedure: CORONARY STENT INTERVENTION;  Surgeon: [name], MD;  Location: [facility];  Service: Cardiovascular;  Laterality: N/A;  . LEFT HEART CATH AND CORONARY ANGIOGRAPHY N/A 09/18/2016   Procedure: Left Heart Cath and Coronary Angiography;  Surgeon: [name], MD;  Location: [facility];  Service: Cardiovascular;  Laterality: N/A;  . LEFT HEART CATH AND CORONARY ANGIOGRAPHY N/A 07/22/2019   Procedure: LEFT HEART CATH AND CORONARY ANGIOGRAPHY;  Surgeon: [name], MD;  Location: [facility];  Service: Cardiovascular;  Laterality: N/A;    Allergies  Allergies  Allergen Reactions  . Lidocaine Other (See Comments)    Makes him sleepy.    History of Present Illness    [name] has a past medical history of STEMI (s/p stenting 09/2016), ischemic cardiomyopathy EF 45-50% October 18, thoracic aortic aneurysm, and HTN.  He presented to the emergency  department on 07/21/2019 with sudden onset chest pain and shortness of breath.  He indicated that his chest pain started prior night around 9 PM.  He was woken from sleep with chest pressure and dyspnea.  He then proceeded to take 2 sublingual nitroglycerin which did not help with his symptoms.  He then presented to the emergency department for further evaluation.  His EKG showed normal sinus rhythm.  His chest x-ray showed pulmonary congestion consistent with pulmonary edema.  His symptoms resolved after he was given a nitroglycerin patch.  His BNP at that time was 645.  He was given IV diuresis and had an output of 3.3 L.  He was then transition to 40 mg of p.o. furosemide daily.  A TEE 07/21/2019 showed a reduction in his ejection fraction from 40-45% to 20-25%.  He underwent cardiac catheterization on 07/22/2019 which showed proximal and mid LAD high-grade plaque with 85% stenosis.  A successful bifurcation PCI with balloon angioplasty of 2 ostial diagonal branches was performed followed by DES x1 to his proximal/mid LAD.  His previous RCA stents were widely patent.  He was placed on aspirin, Brilinta, and atorvastatin 80 mg daily  He presented to the clinic 07/28/19 for follow-up and stated he was feeling well.  He had been walking for a mile per day on his treadmill and riding his stationary bike for 15 to 20 minutes 7 days/week.  He had been following a low-sodium diet.  He works as a Stage manager and states that he was taking some time off but had an irregular work schedule where he may  work 8 hours 1 week and 40 hours the next.  I  stopped his losartan  and started the initial dose of Entresto, order BMP, and gave him the salty 6 sheet.    He presents to the clinic for follow-up evaluation and states he has been doing well.  He is increased his physical activity and is now using his treadmill and stationary bike for about 30 minutes 7 days/week.  He is following a low-sodium diet and has been weighing  daily.  I will add 12.5 mg of spironolactone today, repeat his BMP, and have him return in 2 weeks for medication up titration.  Today he denies chest pain, shortness of breath, lower extremity edema, fatigue, palpitations, melena, hematuria, hemoptysis, diaphoresis, weakness, presyncope, syncope, orthopnea, and PND.  Home Medications    Prior to Admission medications   Medication Sig Start Date End Date Taking? Authorizing Provider  aspirin 81 MG EC tablet Take 1 tablet (81 mg total) by mouth daily. 07/23/19 10/21/19  [place], Alvira Philips, DO  atorvastatin (LIPITOR) 80 MG tablet Take 1 tablet (80 mg total) by mouth daily. 07/23/19 10/21/19  [place], Alvira Philips, DO  ezetimibe (ZETIA) 10 MG tablet Take 1 tablet (10 mg total) by mouth daily. 07/23/19 10/21/19  [place], Alvira Philips, DO  furosemide (LASIX) 40 MG tablet Take 1 tablet (40 mg total) by mouth daily. 07/24/19 10/22/19  [place], Alvira Philips, DO  metoprolol succinate (TOPROL-XL) 50 MG 24 hr tablet Take 1 tablet (50 mg total) by mouth daily. 07/24/19 10/22/19  [place], Alvira Philips, DO  nitroGLYCERIN (NITROSTAT) 0.4 MG SL tablet Place 1 tablet (0.4 mg total) under the tongue every 5 (five) minutes x 3 doses as needed for chest pain. 07/28/19   Ronney Asters, NP  sacubitril-valsartan (ENTRESTO) 24-26 MG Take 1 tablet by mouth 2 (two) times daily. 07/28/19   Ronney Asters, NP  ticagrelor (BRILINTA) 90 MG TABS tablet Take 1 tablet (90 mg total) by mouth 2 (two) times daily. 07/22/19 08/21/19  [place], Eric J, DO    Family History    Family History  Problem Relation Age of Onset  . Heart disease Mother   . Emphysema Father    He indicated that his mother is deceased. He indicated that his father is deceased. He indicated that his sister is alive. He indicated that his brother is alive. He indicated that his maternal grandmother is deceased. He indicated that his maternal grandfather is deceased. He indicated that his paternal grandmother is deceased. He indicated that his  paternal grandfather is deceased.  Social History    Social History   Socioeconomic History  . Marital status: Married    Spouse name: Not on file  . Number of children: Not on file  . Years of education: Not on file  . Highest education level: Not on file  Occupational History  . Not on file  Tobacco Use  . Smoking status: Never Smoker  . Smokeless tobacco: Never Used  Substance and Sexual Activity  . Alcohol use: Not on file  . Drug use: No  . Sexual activity: Not on file  Other Topics Concern  . Not on file  Social History Narrative  . Not on file   Social Determinants of Health   Financial Resource Strain:   . Difficulty of Paying Living Expenses:   Food Insecurity:   . Worried About Programme researcher, broadcasting/film/video in the Last Year:   . [employer] of Food in the Last Year:  Transportation Needs:   . Film/video editor (Medical):   Patrick Weber Kitchen Lack of Transportation (Non-Medical):   Physical Activity:   . Days of Exercise per Week:   . Minutes of Exercise per Session:   Stress:   . Feeling of Stress :   Social Connections:   . Frequency of Communication with Friends and Family:   . Frequency of Social Gatherings with Friends and Family:   . Attends Religious Services:   . Active Member of Clubs or Organizations:   . Attends Archivist Meetings:   Patrick Weber Kitchen Marital Status:   Intimate Partner Violence:   . Fear of Current or Ex-Partner:   . Emotionally Abused:   Patrick Weber Kitchen Physically Abused:   . Sexually Abused:      Review of Systems    General:  No chills, fever, night sweats or weight changes.  Cardiovascular:  No chest pain, dyspnea on exertion, edema, orthopnea, palpitations, paroxysmal nocturnal dyspnea. Dermatological: No rash, lesions/masses Respiratory: No cough, dyspnea Urologic: No hematuria, dysuria Abdominal:   No nausea, vomiting, diarrhea, bright red blood per rectum, melena, or hematemesis Neurologic:  No visual changes, wkns, changes in mental status. All other  systems reviewed and are otherwise negative except as noted above.  Physical Exam    VS:  BP 108/82   Pulse 60   Ht 6\' 1"  (1.854 m)   Wt 177 lb 6.4 oz (80.5 kg)   SpO2 99%   BMI 23.41 kg/m  , BMI Body mass index is 23.41 kg/m. GEN: Well nourished, well developed, in no acute distress. HEENT: normal. Neck: Supple, no JVD, carotid bruits, or masses. Cardiac: RRR, no murmurs, rubs, or gallops. No clubbing, cyanosis, edema.  Radials/DP/PT 2+ and equal bilaterally.  Respiratory:  Respirations regular and unlabored, clear to auscultation bilaterally. GI: Soft, nontender, nondistended, BS + x 4. MS: no deformity or atrophy. Skin: warm and dry, no rash. Neuro:  Strength and sensation are intact. Psych: Normal affect.  Accessory Clinical Findings    ECG personally reviewed by me today-none today.  EKG 07/28/2019 normal sinus rhythm nonspecific T wave abnormality 75 bpm   Echocardiogram 07/21/2019 IMPRESSIONS    1. Left ventricular ejection fraction, by estimation, is 20 to 25%. The  left ventricle has severely decreased function. The left ventricle  demonstrates global hypokinesis. The left ventricular internal cavity size  was mildly to moderately dilated. Left  ventricular diastolic function could not be evaluated. Left ventricular  diastolic function could not be evaluated.  2. Right ventricular systolic function is normal. The right ventricular  size is normal. Tricuspid regurgitation signal is inadequate for assessing  PA pressure.  3. Left atrial size was severely dilated.  4. The mitral valve is grossly normal. Mild to moderate mitral valve  regurgitation. No evidence of mitral stenosis.  5. The aortic valve is tricuspid. Aortic valve regurgitation is mild. No  aortic stenosis is present.  6. 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 Aortic dilatation noted. Aneurysm of  the ascending aorta, measuring 48 mm.  7. The inferior vena cava is dilated in size with <50%  respiratory  variability, suggesting right atrial pressure of 15 mmHg.   Comparison(s): A prior study was performed on 02/13/2017. Prior images  unable to be directly viewed, comparison made by report only. Changes from  prior study are noted. The left ventricular function is significantly  worse. EF now severely reduced.  Ascending aortic aneurysm similar to last CTA.  Cardiac catheterization 07/22/2019 Significant underlying two-vessel coronary  artery disease with widely patent RCA stent with no significant restenosis. There is moderate mid RCA stenosis just distal to the previously placed stent. This does not appear to be flow-limiting. Progression of complex moderately calcified proximal to mid LAD disease involving 2 large diagonals which had significant ostial stenosis. 2. Severely reduced LV systolic function with an EF of 25%. Normal LVEDP at 8 mmHg. 3. Successful complex bifurcation PCI with balloon angioplasty of 2 ostial diagonal branches followed by drug-eluting stent placement to the proximal/mid LAD with excellent results.  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 Recommend aggressive medical therapy. Continue dual antiplatelet therapy for at least 12 months and preferably indefinitely. I switched metoprolol tartrate to metoprolol succinate given cardiomyopathy. If blood pressure allows, consider switching losartan to Entresto and adding spironolactone. The patient does not appear to be volume overloaded and I switch him from IV to oral furosemide. The burden of PVCs is concerning and might be contributing to his cardiomyopathy. Consider EP evaluation.  Diagnostic Dominance: Right  Intervention     Assessment & Plan   1.   Acute on chronic systolic CHF- euvolemic today. No increased work of breathing.  Echocardiogram 07/21/2019 showed a decrease in his LVEF from 40-45% to 20-25% with global hypokinesis.   BP today  108/82  Continue Entresto  24/26-plan to increase 49/51 twice daily with next visit. Continue metoprolol succinate 50 mg daily Continue furosemide 40 mg tablet daily  Start spironolactone 12.5 mg daily Heart healthy low-sodium diet Increase physical activity as tolerated Blood pressure log given. Recommend repeat echocardiogram when medications have been optimized for 1 month Order BMP today   Coronary artery disease-no chest pain .  Cardiac catheterization  07/22/2019 which showed proximal and mid LAD high-grade plaque with 85% stenosis.  A successful bifurcation PCI with balloon angioplasty of 2 ostial diagonal branches was performed followed by DES x1 to his proximal/mid LAD.  His previous RCA stents were widely patent. Continue aspirin 81 mg daily Continue atorvastatin 80 mg tablet daily Continue metoprolol succinate 50 mg tablet daily Continue continue Brilinta 90 mg twice daily Heart healthy low-sodium diet Increase physical activity as tolerated Refill sublingual nitroglycerin  Hyperlipidemia-LDL 64 05/10/2018 Continue atorvastatin 80 mg tablet daily Heart healthy low-sodium high-fiber diet Increase physical activity as tolerated   Disposition: Follow-up with me or Dr. Jacques Navy in 2 weeks.   Thomasene Ripple. Lamoine Fredricksen NP-C     [facility] [address] Office [phone] Fax [phone]

## 2019-08-11 ENCOUNTER — Ambulatory Visit: Payer: Medicare HMO | Admitting: General Practice

## 2019-08-11 ENCOUNTER — Encounter: Payer: Self-pay | Admitting: General Practice

## 2019-08-11 ENCOUNTER — Other Ambulatory Visit: Payer: Self-pay

## 2019-08-11 VITALS — BP 108/82 | HR 60 | Ht 73.0 in | Wt 177.4 lb

## 2019-08-11 DIAGNOSIS — Z79899 Other long term (current) drug therapy: Secondary | ICD-10-CM

## 2019-08-11 DIAGNOSIS — E785 Hyperlipidemia, unspecified: Secondary | ICD-10-CM | POA: Diagnosis not present

## 2019-08-11 DIAGNOSIS — I2510 Atherosclerotic heart disease of native coronary artery without angina pectoris: Secondary | ICD-10-CM

## 2019-08-11 DIAGNOSIS — I5023 Acute on chronic systolic (congestive) heart failure: Secondary | ICD-10-CM | POA: Diagnosis not present

## 2019-08-11 LAB — BASIC METABOLIC PANEL
BUN/Creatinine Ratio: 23 (ref 10–24)
BUN: 23 mg/dL (ref 8–27)
CO2: 25 mmol/L (ref 20–29)
Calcium: 9.1 mg/dL (ref 8.6–10.2)
Chloride: 101 mmol/L (ref 96–106)
Creatinine, Ser: 0.99 mg/dL (ref 0.76–1.27)
GFR calc Af Amer: 88 mL/min/{1.73_m2} (ref >59)
GFR calc non Af Amer: 76 mL/min/{1.73_m2} (ref >59)
Glucose: 91 mg/dL (ref 65–99)
Potassium: 3.9 mmol/L (ref 3.5–5.2)
Sodium: 142 mmol/L (ref 134–144)

## 2019-08-11 MED ORDER — SPIRONOLACTONE 25 MG PO TABS: 12.5000 mg | ORAL_TABLET | Freq: Every day | ORAL | 3 refills | Status: DC

## 2019-08-11 MED ORDER — ENTRESTO 24-26 MG PO TABS: 1.0000 | ORAL_TABLET | Freq: Two times a day (BID) | ORAL | 3 refills | Status: DC

## 2019-08-11 NOTE — Patient Instructions (Addendum)
Medication Instructions:  START SPIRONOLACTONE 1/2 tablet (12.5 mg total) by mouth daily. *If you need a refill on your cardiac medications before your next appointment, please call your pharmacy*  Lab Work: BMET TODAY HERE AT OUR [facility] If you have labs (blood work) drawn today and your tests are completely normal, you will receive your results only by:  MyChart Message (if you have MyChart) OR A paper copy in the mail.  If you have any lab test that is abnormal or we need to change your treatment, we will call you to review the results.  Special Instructions TAKE AND LOG YOUR BLOOD PRESSURE DAILY-SOMETIMES IN THE AM AND SOMETIMES IN THE PM-AFTER TAKING MEDICATION.  PLEASE READ AND FOLLOW SALTY 6-ATTACHED  Follow-Up: Your next appointment:  2 week(s)  In Person with JESSE CLEAVER,FNP-C  At [facility], you and your health needs are our priority.  As part of our continuing mission to provide you with exceptional heart care, we have created designated Provider Care Teams.  These Care Teams include your primary Cardiologist (physician) and Advanced Practice Providers (APPs -  Physician Assistants and Nurse Practitioners) who all work together to provide you with the care you need, when you need it.  We recommend signing up for the patient portal called "MyChart".  Sign up information is provided on this After Visit Summary.  MyChart is used to connect with patients for Virtual Visits (Telemedicine).  Patients are able to view lab/test results, encounter notes, upcoming appointments, etc.  Non-urgent messages can be sent to your provider as well.   To learn more about what you can do with MyChart, go to [email or web address].

## 2019-08-28 NOTE — Progress Notes (Signed)
Cardiology Clinic Note   Patient Name: Patrick Weber Date of Encounter: 08/29/2019  Primary Care Provider:  Patient, No Pcp Per Primary Cardiologist:  Elouise Munroe, MD  Patient Profile    Patrick Weber 74 year old male presents today for follow-up of hisacute on chronicsystolic heart failure, ICM, essential hypertension, and hyperlipidemia.  Past Medical History    Past Medical History:  Diagnosis Date  . Acute ST elevation myocardial infarction (STEMI) of inferior wall ([place]) 09/18/2016  . Coronary artery disease involving native coronary artery of native heart with unstable angina pectoris ([place]) 09/18/2016   100% thrombotic RCA occlusion - DES x 2 with distal embolization.   . Hyperlipemia    Past Surgical History:  Procedure Laterality Date  . CORONARY BALLOON ANGIOPLASTY N/A 07/22/2019   Procedure: CORONARY BALLOON ANGIOPLASTY;  Surgeon: Wellington Hampshire, MD;  Location: [facility];  Service: Cardiovascular;  Laterality: N/A;  . CORONARY STENT INTERVENTION N/A 07/22/2019   Procedure: CORONARY STENT INTERVENTION;  Surgeon: Wellington Hampshire, MD;  Location: [facility];  Service: Cardiovascular;  Laterality: N/A;  . LEFT HEART CATH AND CORONARY ANGIOGRAPHY N/A 09/18/2016   Procedure: Left Heart Cath and Coronary Angiography;  Surgeon: Nelva Bush, MD;  Location: [facility];  Service: Cardiovascular;  Laterality: N/A;  . LEFT HEART CATH AND CORONARY ANGIOGRAPHY N/A 07/22/2019   Procedure: LEFT HEART CATH AND CORONARY ANGIOGRAPHY;  Surgeon: Wellington Hampshire, MD;  Location: [facility];  Service: Cardiovascular;  Laterality: N/A;    Allergies  Allergies  Allergen Reactions  . Lidocaine Other (See Comments)    Makes him sleepy.    History of Present Illness    Patrick Weber has a past medical history of STEMI (s/p stenting 09/2016), ischemic cardiomyopathy EF 45-50% October 18, thoracic aortic aneurysm, and HTN. He presented to the emergency  department on 07/21/2019 with sudden onset chest pain and shortness of breath. He indicated that his chest pain started prior night around 9 PM. He was woken from sleep with chest pressure and dyspnea. He then proceeded to take 2 sublingual nitroglycerin which did not help with his symptoms. He then presented to the emergency department for further evaluation. His EKG showed normal sinus rhythm. His chest x-ray showed pulmonary congestion consistent with pulmonary edema. His symptoms resolved after he was given a nitroglycerin patch. His BNP at that time was 645. He was given IV diuresis and had an output of 3.3 L. He was then transition to 40 mg of p.o. furosemide daily. A TEE 07/21/2019 showed a reduction in his ejection fraction from 40-45% to 20-25%. He underwent cardiac catheterization on 07/22/2019 which showed proximal and mid LAD high-grade plaque with 85% stenosis. A successful bifurcation PCI with balloon angioplasty of 2 ostial diagonal branches was performed followed by DES x1 to his proximal/mid LAD. His previous RCA stents were widely patent. He was placed on aspirin, Brilinta, and atorvastatin 80 mg daily  He presented to the clinic 07/28/19 for follow-up and statedhe was feeling well. He had been walking for a mile per day on his treadmill and riding his stationary bike for 15 to 20 minutes 7 days/week. He had been following a low-sodium diet. 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 I  stopped his losartan  and started the initial dose of  Entresto, order BMP, and gave him the salty 6 sheet.   He presented to the clinic for follow-up evaluation 08/11/19 and stated he had been doing well.  He had increased his physical activity and was  using his treadmill and stationary bike for about 30 minutes 7 days/week.  He was following a low-sodium diet and had been  weighing daily.  I  added 12.5 mg of spironolactone  repeated his BMP, and had him return in 2 weeks for medication up titration.  He presents the clinic today and states he feels well.  He continues his exercise routine and eating a low-sodium diet.  He has started doing small tile jobs again.  He has tolerated his addition of spironolactone and does not have any side effects.  I will increase his Entresto today to 49-51 and order a BMP.  I will have him follow-up in 2 weeks.  Today hedenies chest pain, shortness of breath, lower extremity edema, fatigue, palpitations, melena, hematuria, hemoptysis, diaphoresis, weakness, presyncope, syncope, orthopnea, and PND.  Home Medications    Prior to Admission medications   Medication Sig Start Date End Date Taking? Authorizing Provider  aspirin 81 MG EC tablet Take 1 tablet (81 mg total) by mouth daily. 07/23/19 10/21/19  [place], Alvira Philips, DO  atorvastatin (LIPITOR) 80 MG tablet Take 1 tablet (80 mg total) by mouth daily. 07/23/19 10/21/19  [place], Alvira Philips, DO  ezetimibe (ZETIA) 10 MG tablet Take 1 tablet (10 mg total) by mouth daily. 07/23/19 10/21/19  [place], Alvira Philips, DO  furosemide (LASIX) 40 MG tablet Take 1 tablet (40 mg total) by mouth daily. 07/24/19 10/22/19  [place], Alvira Philips, DO  metoprolol succinate (TOPROL-XL) 50 MG 24 hr tablet Take 1 tablet (50 mg total) by mouth daily. 07/24/19 10/22/19  [place], Alvira Philips, DO  nitroGLYCERIN (NITROSTAT) 0.4 MG SL tablet Place 1 tablet (0.4 mg total) under the tongue every 5 (five) minutes x 3 doses as needed for chest pain. 07/28/19   Ronney Asters, NP  sacubitril-valsartan (ENTRESTO) 24-26 MG Take 1 tablet by mouth 2 (two) times daily. 08/11/19   Ronney Asters, NP  spironolactone (ALDACTONE) 25 MG tablet Take 0.5 tablets (12.5 mg total) by mouth daily. 08/11/19 11/09/19  Ronney Asters, NP    Family History    Family History  Problem Relation Age of Onset  . Heart disease Mother   . Emphysema Father    He  indicated that his mother is deceased. He indicated that his father is deceased. He indicated that his sister is alive. He indicated that his brother is alive. He indicated that his maternal grandmother is deceased. He indicated that his maternal grandfather is deceased. He indicated that his paternal grandmother is deceased. He indicated that his paternal grandfather is deceased.  Social History    Social History   Socioeconomic History  . Marital status: Married    Spouse name: Not on file  . Number of children: Not on file  . Years of education: Not on file  . Highest education level: Not on file  Occupational History  . Not on file  Tobacco Use  . Smoking status: Never Smoker  . Smokeless tobacco: Never Used  Substance and Sexual Activity  . Alcohol use: Not on file  . Drug use: No  . Sexual activity: Not on file  Other Topics Concern  . Not on file  Social History Narrative  . Not on file   Social Determinants of Health  Financial Resource Strain:   . Difficulty of Paying Living Expenses:   Food Insecurity:   . Worried About Programme researcher, broadcasting/film/video in the Last Year:   . Barista in the Last Year:   Transportation Needs:   . Freight forwarder (Medical):   [name] Lack of Transportation (Non-Medical):   Physical Activity:   . Days of Exercise per Week:   . Minutes of Exercise per Session:   Stress:   . Feeling of Stress :   Social Connections:   . Frequency of Communication with Friends and Family:   . Frequency of Social Gatherings with Friends and Family:   . Attends Religious Services:   . Active Member of Clubs or Organizations:   . Attends Banker Meetings:   [name] Marital Status:   Intimate Partner Violence:   . Fear of Current or Ex-Partner:   . Emotionally Abused:   [name] Physically Abused:   . Sexually Abused:      Review of Systems    General:  No chills, fever, night sweats or weight changes.  Cardiovascular:  No chest pain, dyspnea on  exertion, edema, orthopnea, palpitations, paroxysmal nocturnal dyspnea. Dermatological: No rash, lesions/masses Respiratory: No cough, dyspnea Urologic: No hematuria, dysuria Abdominal:   No nausea, vomiting, diarrhea, bright red blood per rectum, melena, or hematemesis Neurologic:  No visual changes, wkns, changes in mental status. All other systems reviewed and are otherwise negative except as noted above.  Physical Exam    VS:  BP 104/72   Pulse 71   Ht 6' (1.829 m)   Wt 179 lb (81.2 kg)   SpO2 99%   BMI 24.28 kg/m  , BMI Body mass index is 24.28 kg/m. GEN: Well nourished, well developed, in no acute distress. HEENT: normal. Neck: Supple, no JVD, carotid bruits, or masses. Cardiac: RRR, no murmurs, rubs, or gallops. No clubbing, cyanosis, edema.  Radials/DP/PT 2+ and equal bilaterally.  Respiratory:  Respirations regular and unlabored, clear to auscultation bilaterally. GI: Soft, nontender, nondistended, BS + x 4. MS: no deformity or atrophy. Skin: warm and dry, no rash. Neuro:  Strength and sensation are intact. Psych: Normal affect.  Accessory Clinical Findings     ECG personally reviewed by me today-none today.  EKG 07/28/2019 normal sinus rhythm nonspecific T wave abnormality 75 bpm   Echocardiogram3/18/2021 IMPRESSIONS    1. Left ventricular ejection fraction, by estimation, is 20 to 25%. The  left ventricle has severely decreased function. The left ventricle  demonstrates global hypokinesis. The left ventricular internal cavity size  was mildly to moderately dilated. Left  ventricular diastolic function could not be evaluated. Left ventricular  diastolic function could not be evaluated.  2. Right ventricular systolic function is normal. The right ventricular  size is normal. Tricuspid regurgitation signal is inadequate for assessing  PA pressure.  3. Left atrial size was severely dilated.  4. The mitral valve is grossly normal. Mild to moderate  mitral valve  regurgitation. No evidence of mitral stenosis.  5. The aortic valve is tricuspid. Aortic valve regurgitation is mild. No  aortic stenosis is present.  6. 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 Aortic dilatation noted. Aneurysm of  the ascending aorta, measuring 48 mm.  7. The inferior vena cava is dilated in size with <50% respiratory  variability, suggesting right atrial pressure of 15 mmHg.   Comparison(s): A prior study was performed on 02/13/2017. Prior images  unable to be directly viewed, comparison made  by report only. Changes from  prior study are noted. The left ventricular function is significantly  worse. EF now severely reduced.  Ascending aortic aneurysm similar to last CTA.  Cardiac catheterization3/19/2021 Significant underlying two-vessel coronary artery disease with widely patent RCA stent with no significant restenosis. There is moderate mid RCA stenosis just distal to the previously placed stent. This does not appear to be flow-limiting. Progression of complex moderately calcified proximal to mid LAD disease involving 2 large diagonals which had significant ostial stenosis. 2. Severely reduced LV systolic function with an EF of 25%. Normal LVEDP at 8 mmHg. 3. Successful complex bifurcation PCI with balloon angioplasty of 2 ostial diagonal branches followed by drug-eluting stent placement to the proximal/mid LAD with excellent results.  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 Recommend aggressive medical therapy. Continue dual antiplatelet therapy for at least 12 months and preferably indefinitely. I switched metoprolol tartrate to metoprolol succinate given cardiomyopathy. If blood pressure allows, consider switching losartan to Entresto and adding spironolactone. The patient does not appear to be volume overloaded and I switch him from IV to oral furosemide. The burden of PVCs is concerning  and might be contributing to his cardiomyopathy. Consider EP evaluation.  Diagnostic Dominance: Right  Intervention     Assessment & Plan   1.  Acute on chronic systolic CHF- euvolemic today.No increased work of breathing. Echocardiogram 07/21/2019 showed a decrease in his LVEF from 40-45% to 20-25% with global hypokinesis. BP today  104/72 Increase Entresto  49/51 twice daily  Continue metoprolol succinate 50 mg daily Continue furosemide 40 mg tablet daily  Continue spironolactone 12.5 mg daily Heart healthy low-sodium diet Increase physical activity as tolerated Blood pressure log given. Recommend repeat echocardiogram when medications have been optimized for 1 month Order BMPtoday   Coronary artery disease-no chest pain . Cardiac catheterization 07/22/2019 which showed proximal and mid LAD high-grade plaque with 85% stenosis. A successful bifurcation PCI with balloon angioplasty of 2 ostial diagonal branches was performed followed by DES x1 to his proximal/mid LAD. His previous RCA stents were widely patent. Continue aspirin 81 mg daily Continue atorvastatin 80 mg tablet daily Continue metoprolol succinate 50 mg tablet daily Continue continue Brilinta 90 mg twice daily Heart healthy low-sodium diet Increase physical activity as tolerated Refill sublingual nitroglycerin  Hyperlipidemia-LDL 64 05/10/2018 Continue atorvastatin 80 mg tablet daily Heart healthy low-sodium high-fiber diet Increase physical activity as tolerated   Disposition: Follow-up with me or Dr.Acharyain 2 weeks.   Thomasene Ripple. [facility] NP-C     [facility] [address] Office [phone] Fax [phone]  Thomasene Ripple. Elsia Lasota NP-C    08/29/2019, 9:01 AM [facility] [address] Office [phone] Fax [phone]

## 2019-08-29 ENCOUNTER — Ambulatory Visit: Payer: Medicare HMO | Admitting: General Practice

## 2019-08-29 ENCOUNTER — Other Ambulatory Visit: Payer: Self-pay

## 2019-08-29 ENCOUNTER — Encounter: Payer: Self-pay | Admitting: General Practice

## 2019-08-29 VITALS — BP 104/72 | HR 71 | Ht 72.0 in | Wt 179.0 lb

## 2019-08-29 DIAGNOSIS — I5023 Acute on chronic systolic (congestive) heart failure: Secondary | ICD-10-CM

## 2019-08-29 DIAGNOSIS — Z79899 Other long term (current) drug therapy: Secondary | ICD-10-CM | POA: Diagnosis not present

## 2019-08-29 DIAGNOSIS — I2510 Atherosclerotic heart disease of native coronary artery without angina pectoris: Secondary | ICD-10-CM

## 2019-08-29 DIAGNOSIS — E785 Hyperlipidemia, unspecified: Secondary | ICD-10-CM | POA: Diagnosis not present

## 2019-08-29 LAB — BASIC METABOLIC PANEL
BUN/Creatinine Ratio: 18 (ref 10–24)
BUN: 17 mg/dL (ref 8–27)
CO2: 26 mmol/L (ref 20–29)
Calcium: 9.2 mg/dL (ref 8.6–10.2)
Chloride: 106 mmol/L (ref 96–106)
Creatinine, Ser: 0.95 mg/dL (ref 0.76–1.27)
GFR calc Af Amer: 92 mL/min/{1.73_m2} (ref >59)
GFR calc non Af Amer: 80 mL/min/{1.73_m2} (ref >59)
Glucose: 79 mg/dL (ref 65–99)
Potassium: 4.3 mmol/L (ref 3.5–5.2)
Sodium: 143 mmol/L (ref 134–144)

## 2019-08-29 MED ORDER — ENTRESTO 49-51 MG PO TABS: 1.0000 | ORAL_TABLET | Freq: Two times a day (BID) | ORAL | 3 refills | Status: DC

## 2019-08-29 MED ORDER — TICAGRELOR 90 MG PO TABS: 90.0000 mg | ORAL_TABLET | Freq: Two times a day (BID) | ORAL | 1 refills | Status: AC

## 2019-08-29 NOTE — Patient Instructions (Signed)
Medication Instructions:  INCREASE ENTRESTO 49-51MG  DAILY *If you need a refill on your cardiac medications before your next appointment, please call your pharmacy*  Lab Work: BMET TODAY If you have labs (blood work) drawn today and your tests are completely normal, you will receive your results only by:  MyChart Message (if you have MyChart) OR A paper copy in the mail.  If you have any lab test that is abnormal or we need to change your treatment, we will call you to review the results. You may go to any [facility] that is convenient for you however, we do have a lab in our office that is able to assist you. You DO NOT need an appointment for our lab. The lab is open 8:00am and closes at 4:00pm. Lunch 12:45 - 1:45pm.  Special Instructions PLEASE READ AND FOLLOW SALTY 6-ATTACHED  Follow-Up: Your next appointment:  2 week(s)In Person with You may see [name], MD or Edd Fabian, FNP-C  At [facility], you and your health needs are our priority.  As part of our continuing mission to provide you with exceptional heart care, we have created designated Provider Care Teams.  These Care Teams include your primary Cardiologist (physician) and Advanced Practice Providers (APPs -  Physician Assistants and Nurse Practitioners) who all work together to provide you with the care you need, when you need it.  We recommend signing up for the patient portal called "MyChart".  Sign up information is provided on this After Visit Summary.  MyChart is used to connect with patients for Virtual Visits (Telemedicine).  Patients are able to view lab/test results, encounter notes, upcoming appointments, etc.  Non-urgent messages can be sent to your provider as well.   To learn more about what you can do with MyChart, go to [email or web address].

## 2019-08-29 NOTE — Addendum Note (Signed)
Addended by: Alyson Ingles on: 08/29/2019 09:39 AM   Modules accepted: Orders

## 2019-09-12 NOTE — Progress Notes (Signed)
Cardiology Clinic Note   Patient Name: Patrick Weber Date of Encounter: 09/13/2019  Primary Care Provider:  Patient, No Pcp Per Primary Cardiologist:  [name], MD  Patient Profile    Patrick Weber 74 year old male presents today for follow-up of hisacute on chronicsystolic heart failure, ICM, essential hypertension, and hyperlipidemia.  Past Medical History    Past Medical History:  Diagnosis Date  . Acute ST elevation myocardial infarction (STEMI) of inferior wall (HCC) 09/18/2016  . 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   . Hyperlipemia    Past Surgical History:  Procedure Laterality Date  . CORONARY BALLOON ANGIOPLASTY N/A 07/22/2019   Procedure: CORONARY BALLOON ANGIOPLASTY;  Surgeon: [name], MD;  Location: [facility];  Service: Cardiovascular;  Laterality: N/A;  . CORONARY STENT INTERVENTION N/A 07/22/2019   Procedure: CORONARY STENT INTERVENTION;  Surgeon: [name], MD;  Location: [facility];  Service: Cardiovascular;  Laterality: N/A;  . LEFT HEART CATH AND CORONARY ANGIOGRAPHY N/A 09/18/2016   Procedure: Left Heart Cath and Coronary Angiography;  Surgeon: [name], MD;  Location: [facility];  Service: Cardiovascular;  Laterality: N/A;  . LEFT HEART CATH AND CORONARY ANGIOGRAPHY N/A 07/22/2019   Procedure: LEFT HEART CATH AND CORONARY ANGIOGRAPHY;  Surgeon: [name], MD;  Location: [facility];  Service: Cardiovascular;  Laterality: N/A;    Allergies  Allergies  Allergen Reactions  . Lidocaine Other (See Comments)    Makes him sleepy.    History of Present Illness    [name] has a past medical history of STEMI (s/p stenting 09/2016), ischemic cardiomyopathy EF 45-50% October 18, thoracic aortic aneurysm, and HTN. He presented to the emergency  department on 07/21/2019 with sudden onset chest pain and shortness of breath. He indicated that his chest pain started prior night around 9 PM. He was woken from sleep with chest pressure and dyspnea. He then proceeded to take 2 sublingual nitroglycerin which did not help with his symptoms. He then presented to the emergency department for further evaluation. His EKG showed normal sinus rhythm. His chest x-ray showed pulmonary congestion consistent with pulmonary edema. His symptoms resolved after he was given a nitroglycerin patch. His BNP at that time was 645. He was given IV diuresis and had an output of 3.3 L. He was then transition to 40 mg of p.o. furosemide daily. A TEE 07/21/2019 showed a reduction in his ejection fraction from 40-45% to 20-25%. He underwent cardiac catheterization on 07/22/2019 which showed proximal and mid LAD high-grade plaque with 85% stenosis. A successful bifurcation PCI with balloon angioplasty of 2 ostial diagonal branches was performed followed by DES x1 to his proximal/mid LAD. His previous RCA stents were widely patent. He was placed on aspirin, Brilinta, and atorvastatin 80 mg daily  He presented tothe clinic 3/25/21for follow-up and statedhewasfeeling well. He hadbeen walking for a mile per day on his treadmill and riding his stationary bike for 15 to 20 minutes 7 days/week. He hadbeen following a low-sodium diet. He works as a Financial planner some time off but hadan irregular work schedule where he may work 8 hours 1 week and 40 hours the next. I stoppedhis losartan and startedthe initial dose of Entresto, order BMP, and gavehim thesalty 6 sheet.  He presented to the  clinic for follow-up evaluation 08/11/19 and statedhe had been doing well. He had increased his physical activity and was  using his treadmill and stationary bike for about 30 minutes 7 days/week. He was following a low-sodium diet and had been  weighing daily. I  added 12.5 mg of spironolactone  repeated his BMP, and had him return in 2 weeks for medication up titration.  He presented to the clinic 08/29/19 and stated he felt well.  He continued his exercise routine and eating a low-sodium diet.  He had started doing small tile jobs again.  He had tolerated his addition of spironolactone and did not have any side effects.  I increased his Entresto today to 49-51 and orded a BMP.  I will have him follow-up in 2 weeks.  He presents to clinic today and states he continues to feel well.  He continues to follow a low-sodium diet and exercise daily.  Is also continuing to work doing small tile jobs.  His blood pressure is 96/70 today.  He has been monitoring at home but does not have to log today.  I will maintain his current medication regimen for now and recheck BMP.-Follow-up in 2 weeks for further evaluation.  Today hedenies chest pain, shortness of breath, lower extremity edema, fatigue, palpitations, melena, hematuria, hemoptysis, diaphoresis, weakness, presyncope, syncope, orthopnea, and PND.  Home Medications    Prior to Admission medications   Medication Sig Start Date End Date Taking? Authorizing Provider  aspirin 81 MG EC tablet Take 1 tablet (81 mg total) by mouth daily. 07/23/19 10/21/19  [place], Alvira Philips, DO  atorvastatin (LIPITOR) 80 MG tablet Take 1 tablet (80 mg total) by mouth daily. 07/23/19 10/21/19  [place], Alvira Philips, DO  ezetimibe (ZETIA) 10 MG tablet Take 1 tablet (10 mg total) by mouth daily. 07/23/19 10/21/19  [place], Alvira Philips, DO  furosemide (LASIX) 40 MG tablet Take 1 tablet (40 mg total) by mouth daily. 07/24/19 10/22/19  [place], Alvira Philips, DO  metoprolol succinate (TOPROL-XL) 50 MG 24 hr tablet Take 1 tablet (50 mg total) by mouth daily. 07/24/19 10/22/19  [place], Alvira Philips, DO  nitroGLYCERIN (NITROSTAT) 0.4 MG SL tablet Place 1 tablet (0.4 mg total) under the tongue every 5 (five) minutes x 3 doses as needed for chest pain.  07/28/19   Ronney Asters, NP  sacubitril-valsartan (ENTRESTO) 49-51 MG Take 1 tablet by mouth 2 (two) times daily. 08/29/19   Ronney Asters, NP  spironolactone (ALDACTONE) 25 MG tablet Take 0.5 tablets (12.5 mg total) by mouth daily. 08/11/19 11/09/19  Ronney Asters, NP  ticagrelor (BRILINTA) 90 MG TABS tablet Take 1 tablet (90 mg total) by mouth 2 (two) times daily. 08/29/19   Ronney Asters, NP    Family History    Family History  Problem Relation Age of Onset  . Heart disease Mother   . Emphysema Father    He indicated that his mother is deceased. He indicated that his father is deceased. He indicated that his sister is alive. He indicated that his brother is alive. He indicated that his maternal grandmother is deceased. He indicated that his maternal grandfather is deceased. He indicated that his paternal grandmother is deceased. He indicated that his paternal grandfather is deceased.  Social History    Social History   Socioeconomic History  . Marital status: Married    Spouse name: Not on file  . Number of children: Not on file  . Years of education: Not on file  .  Highest education level: Not on file  Occupational History  . Not on file  Tobacco Use  . Smoking status: Never Smoker  . Smokeless tobacco: Never Used  Substance and Sexual Activity  . Alcohol use: Not on file  . Drug use: No  . Sexual activity: Not on file  Other Topics Concern  . Not on file  Social History Narrative  . Not on file   Social Determinants of Health   Financial Resource Strain:   . Difficulty of Paying Living Expenses:   Food Insecurity:   . Worried About Programme researcher, broadcasting/film/video in the Last Year:   . Barista in the Last Year:   Transportation Needs:   . Freight forwarder (Medical):   [name] Lack of Transportation (Non-Medical):   Physical Activity:   . Days of Exercise per Week:   . Minutes of Exercise per Session:   Stress:   . Feeling of Stress :   Social Connections:     . Frequency of Communication with Friends and Family:   . Frequency of Social Gatherings with Friends and Family:   . Attends Religious Services:   . Active Member of Clubs or Organizations:   . Attends Banker Meetings:   [name] Marital Status:   Intimate Partner Violence:   . Fear of Current or Ex-Partner:   . Emotionally Abused:   [name] Physically Abused:   . Sexually Abused:      Review of Systems    General:  No chills, fever, night sweats or weight changes.  Cardiovascular:  No chest pain, dyspnea on exertion, edema, orthopnea, palpitations, paroxysmal nocturnal dyspnea. Dermatological: No rash, lesions/masses Respiratory: No cough, dyspnea Urologic: No hematuria, dysuria Abdominal:   No nausea, vomiting, diarrhea, bright red blood per rectum, melena, or hematemesis Neurologic:  No visual changes, wkns, changes in mental status. All other systems reviewed and are otherwise negative except as noted above.  Physical Exam    VS:  BP 96/70 (BP Location: Left Arm, Patient Position: Sitting, Cuff Size: Normal)   Pulse 68   Temp (!) 97.3 F (36.3 C)   Ht 6' (1.829 m)   Wt 177 lb (80.3 kg)   BMI 24.01 kg/m  , BMI Body mass index is 24.01 kg/m. GEN: Well nourished, well developed, in no acute distress. HEENT: normal. Neck: Supple, no JVD, carotid bruits, or masses. Cardiac: RRR, no murmurs, rubs, or gallops. No clubbing, cyanosis, edema.  Radials/DP/PT 2+ and equal bilaterally.  Respiratory:  Respirations regular and unlabored, clear to auscultation bilaterally. GI: Soft, nontender, nondistended, BS + x 4. MS: no deformity or atrophy. Skin: warm and dry, no rash. Neuro:  Strength and sensation are intact. Psych: Normal affect.  Accessory Clinical Findings     ECG personally reviewed by me today-none today.  EKG 07/28/2019 normal sinus rhythm nonspecific T wave abnormality 75 bpm   Echocardiogram3/18/2021 IMPRESSIONS    1. Left ventricular ejection  fraction, by estimation, is 20 to 25%. The  left ventricle has severely decreased function. The left ventricle  demonstrates global hypokinesis. The left ventricular internal cavity size  was mildly to moderately dilated. Left  ventricular diastolic function could not be evaluated. Left ventricular  diastolic function could not be evaluated.  2. Right ventricular systolic function is normal. The right ventricular  size is normal. Tricuspid regurgitation signal is inadequate for assessing  PA pressure.  3. Left atrial size was severely dilated.  4. The mitral valve is grossly  normal. Mild to moderate mitral valve  regurgitation. No evidence of mitral stenosis.  5. The aortic valve is tricuspid. Aortic valve regurgitation is mild. No  aortic stenosis is present.  6. 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 Aortic dilatation noted. Aneurysm of  the ascending aorta, measuring 48 mm.  7. The inferior vena cava is dilated in size with <50% respiratory  variability, suggesting right atrial pressure of 15 mmHg.   Comparison(s): A prior study was performed on 02/13/2017. Prior images  unable to be directly viewed, comparison made by report only. Changes from  prior study are noted. The left ventricular function is significantly  worse. EF now severely reduced.  Ascending aortic aneurysm similar to last CTA.  Cardiac catheterization3/19/2021 Significant underlying two-vessel coronary artery disease with widely patent RCA stent with no significant restenosis. There is moderate mid RCA stenosis just distal to the previously placed stent. This does not appear to be flow-limiting. Progression of complex moderately calcified proximal to mid LAD disease involving 2 large diagonals which had significant ostial stenosis. 2. Severely reduced LV systolic function with an EF of 25%. Normal LVEDP at 8 mmHg. 3. Successful complex bifurcation PCI with balloon angioplasty of 2 ostial diagonal branches  followed by drug-eluting stent placement to the proximal/mid LAD with excellent results.  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 Recommend aggressive medical therapy. Continue dual antiplatelet therapy for at least 12 months and preferably indefinitely. I switched metoprolol tartrate to metoprolol succinate given cardiomyopathy. If blood pressure allows, consider switching losartan to Entresto and adding spironolactone. The patient does not appear to be volume overloaded and I switch him from IV to oral furosemide. The burden of PVCs is concerning and might be contributing to his cardiomyopathy. Consider EP evaluation.  Diagnostic Dominance: Right  Intervention     Assessment & Plan   1.  Acute on chronic systolic CHF-euvolemictoday.No increased work of breathing. Echocardiogram 07/21/2019 showed a decrease in his LVEF from 40-45% to 20-25% with global hypokinesis. BP today 104/72 Continue Entresto 49/51 twice daily-hold off on 97/103 due to blood pressure. Continue metoprolol succinate 50 mg daily Continue furosemide 40 mg tablet daily Continue spironolactone 12.5 mg daily Heart healthy low-sodium diet Increase physical activity as tolerated Blood pressure log given. Recommend repeat echocardiogram when medications have been optimized for 1 month Order BMPtoday   Coronary artery disease-no chest pain . Cardiac catheterization 07/22/2019 which showed proximal and mid LAD high-grade plaque with 85% stenosis. A successful bifurcation PCI with balloon angioplasty of 2 ostial diagonal branches was performed followed by DES x1 to his proximal/mid LAD. His previous RCA stents were widely patent. Continue aspirin 81 mg daily Continue atorvastatin 80 mg tablet daily Continue metoprolol succinate 50 mg tablet daily Continue continue Brilinta 90 mg twice daily Heart healthy low-sodium diet Increase physical  activity as tolerated Refill sublingual nitroglycerin  Hyperlipidemia-LDL 64 05/10/2018 Continue atorvastatin 80 mg tablet daily Heart healthy low-sodium high-fiber diet Increase physical activity as tolerated   Disposition: Follow-up with me or Dr.Acharyain 2 weeks.  Jossie Ng. Charity Tessier NP-C    09/13/2019, 8:18 AM [address] Office [phone] Fax [phone]

## 2019-09-13 ENCOUNTER — Encounter: Payer: Self-pay | Admitting: General Practice

## 2019-09-13 ENCOUNTER — Other Ambulatory Visit: Payer: Self-pay

## 2019-09-13 ENCOUNTER — Ambulatory Visit: Payer: Medicare HMO | Admitting: General Practice

## 2019-09-13 VITALS — BP 96/70 | HR 68 | Temp 97.3°F | Ht 72.0 in | Wt 177.0 lb

## 2019-09-13 DIAGNOSIS — I2510 Atherosclerotic heart disease of native coronary artery without angina pectoris: Secondary | ICD-10-CM

## 2019-09-13 DIAGNOSIS — I5023 Acute on chronic systolic (congestive) heart failure: Secondary | ICD-10-CM | POA: Diagnosis not present

## 2019-09-13 DIAGNOSIS — Z79899 Other long term (current) drug therapy: Secondary | ICD-10-CM

## 2019-09-13 DIAGNOSIS — E785 Hyperlipidemia, unspecified: Secondary | ICD-10-CM | POA: Diagnosis not present

## 2019-09-13 LAB — BASIC METABOLIC PANEL
BUN/Creatinine Ratio: 18 (ref 10–24)
BUN: 19 mg/dL (ref 8–27)
CO2: 24 mmol/L (ref 20–29)
Calcium: 9.2 mg/dL (ref 8.6–10.2)
Chloride: 103 mmol/L (ref 96–106)
Creatinine, Ser: 1.06 mg/dL (ref 0.76–1.27)
GFR calc Af Amer: 81 mL/min/{1.73_m2} (ref >59)
GFR calc non Af Amer: 70 mL/min/{1.73_m2} (ref >59)
Glucose: 78 mg/dL (ref 65–99)
Potassium: 4.1 mmol/L (ref 3.5–5.2)
Sodium: 141 mmol/L (ref 134–144)

## 2019-09-13 NOTE — Patient Instructions (Addendum)
Medication Instructions:  The current medical regimen is effective;  continue present plan and medications as directed. Please refer to the Current Medication list given to you today. *If you need a refill on your cardiac medications before your next appointment, please call your pharmacy*  Lab Work: BMET TODAY If you have labs (blood work) drawn today and your tests are completely normal, you will receive your results only by:  MyChart Message (if you have MyChart) OR A paper copy in the mail.  If you have any lab test that is abnormal or we need to change your treatment, we will call you to review the results. You may go to any [facility] that is convenient for you however, we do have a lab in our office that is able to assist you. You DO NOT need an appointment for our lab. The lab is open 8:00am and closes at 4:00pm. Lunch 12:45 - 1:45pm.  Special Instructions PLEASE READ AND FOLLOW SALTY 6-ATTACHED  Follow-Up: Your next appointment:  2 week(s) In Person with JESSE CLEAVER,FNP-C  At [facility], you and your health needs are our priority.  As part of our continuing mission to provide you with exceptional heart care, we have created designated Provider Care Teams.  These Care Teams include your primary Cardiologist (physician) and Advanced Practice Providers (APPs -  Physician Assistants and Nurse Practitioners) who all work together to provide you with the care you need, when you need it.  We recommend signing up for the patient portal called "MyChart".  Sign up information is provided on this After Visit Summary.  MyChart is used to connect with patients for Virtual Visits (Telemedicine).  Patients are able to view lab/test results, encounter notes, upcoming appointments, etc.  Non-urgent messages can be sent to your provider as well.   To learn more about what you can do with MyChart, go to [email or web address].

## 2019-10-04 NOTE — Progress Notes (Signed)
Cardiology Clinic Note   Patient Name: PRESLEY SUMMERLIN Date of Encounter: 10/05/2019  Primary Care Provider:  Patient, No Pcp Per Primary Cardiologist:  Elouise Munroe, MD  Patient Profile    [place] 74 year old male presents today for follow-up of hisacute on chronicsystolic heart failure, ICM, essential hypertension, and hyperlipidemia.  Past Medical History    Past Medical History:  Diagnosis Date  . Acute ST elevation myocardial infarction (STEMI) of inferior wall ([place]) 09/18/2016  . Coronary artery disease involving native coronary artery of native heart with unstable angina pectoris ([place]) 09/18/2016   100% thrombotic RCA occlusion - DES x 2 with distal embolization.   . Hyperlipemia    Past Surgical History:  Procedure Laterality Date  . CORONARY BALLOON ANGIOPLASTY N/A 07/22/2019   Procedure: CORONARY BALLOON ANGIOPLASTY;  Surgeon: Wellington Hampshire, MD;  Location: [facility];  Service: Cardiovascular;  Laterality: N/A;  . CORONARY STENT INTERVENTION N/A 07/22/2019   Procedure: CORONARY STENT INTERVENTION;  Surgeon: Wellington Hampshire, MD;  Location: [facility];  Service: Cardiovascular;  Laterality: N/A;  . LEFT HEART CATH AND CORONARY ANGIOGRAPHY N/A 09/18/2016   Procedure: Left Heart Cath and Coronary Angiography;  Surgeon: Nelva Bush, MD;  Location: [facility];  Service: Cardiovascular;  Laterality: N/A;  . LEFT HEART CATH AND CORONARY ANGIOGRAPHY N/A 07/22/2019   Procedure: LEFT HEART CATH AND CORONARY ANGIOGRAPHY;  Surgeon: Wellington Hampshire, MD;  Location: [facility];  Service: Cardiovascular;  Laterality: N/A;    Allergies  Allergies  Allergen Reactions  . Lidocaine Other (See Comments)    Makes him sleepy.    History of Present Illness    Mr. Weinberg a past medical history of STEMI (s/p stenting 09/2016), ischemic cardiomyopathy EF 45-50% October 18, thoracic aortic aneurysm, and HTN. He presented to the emergency  department on 07/21/2019 with sudden onset chest pain and shortness of breath. He indicated that his chest pain started prior night around 9 PM. He was woken from sleep with chest pressure and dyspnea. He then proceeded to take 2 sublingual nitroglycerin which did not help with his symptoms. He then presented to the emergency department for further evaluation. His EKG showed normal sinus rhythm. His chest x-ray showed pulmonary congestion consistent with pulmonary edema. His symptoms resolved after he was given a nitroglycerin patch. His BNP at that time was 645. He was given IV diuresis and had an output of 3.3 L. He was then transition to 40 mg of p.o. furosemide daily. A TEE 07/21/2019 showed a reduction in his ejection fraction from 40-45% to 20-25%. He underwent cardiac catheterization on 07/22/2019 which showed proximal and mid LAD high-grade plaque with 85% stenosis. A successful bifurcation PCI with balloon angioplasty of 2 ostial diagonal branches was performed followed by DES x1 to his proximal/mid LAD. His previous RCA stents were widely patent. He was placed on aspirin, Brilinta, and atorvastatin 80 mg daily  He presented tothe clinic 3/25/21for follow-up and statedhewasfeeling well. He hadbeen walking for a mile per day on his treadmill and riding his stationary bike for 15 to 20 minutes 7 days/week. He hadbeen following a low-sodium diet. 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 I stoppedhis losartan and startedthe initial dose of Entresto, order BMP, and gavehim thesalty 6 sheet.  He presentedto the clinic for  follow-up evaluation 4/8/21and statedhe hadbeen doing well. Hehadincreased his physical activity and wasusing his treadmill and stationary bike for about 30 minutes 7 days/week. Hewasfollowing a low-sodium diet and hadbeen  weighing daily. I added12.5 mg of spironolactone repeatedhis BMP, and hadhim return in 2 weeks for medication up titration.  He presented to the clinic 08/29/19 and statedhe felt well. He continued his exercise routine and eating a low-sodium diet. He had started doing small tile jobs again. He had tolerated his addition of spironolactone and did not have any side effects. I increased his Entresto today to 49-51 and orded a BMP. I will have him follow-up in 2 weeks.  He presented to clinic 09/13/19 and stated he continued to feel well.  He continued to follow a low-sodium diet and exercise daily.  Was also continuing to work doing small tile jobs.  His blood pressure was 96/70.  He had been monitoring at home but did not have his log .  I will maintain his current medication regimen for now and recheck BMP.-Follow-up in 2 weeks for further evaluation.  He presents to clinic today for follow-up evaluation and states he is doing well but has slowed down with his physical activity due to right hip pain.  He has been experiencing intermittent periods of right sharp hip pain that causes him to stop and change positions.  He states the pain well subside after a few minutes of rest.  The pain for started 2 weeks ago while he was walking in [facility] department store.  He had 2 episodes at that time and does not feel it is related to muscle soreness or cramping.  He then had another episode a week later while walking on his treadmill.  He states he does not have the pain with using his stationary bike.  He does not have any hip pain today.  He denies tingling or numbness in his right leg.  His blood pressure is back to baseline today I will increase his Entresto, have him present in 2 weeks for BMP and see him in the office again 4 weeks.   Today hedenies chest pain, shortness of breath, lower extremity edema, fatigue, palpitations, melena, hematuria, hemoptysis, diaphoresis, weakness, presyncope,  syncope, orthopnea, and PND.  Home Medications    Prior to Admission medications   Medication Sig Start Date End Date Taking? Authorizing Provider  aspirin 81 MG EC tablet Take 1 tablet (81 mg total) by mouth daily. 07/23/19 10/21/19  [place], Alvira Philips, DO  atorvastatin (LIPITOR) 80 MG tablet Take 1 tablet (80 mg total) by mouth daily. 07/23/19 10/21/19  [place], Alvira Philips, DO  ezetimibe (ZETIA) 10 MG tablet Take 1 tablet (10 mg total) by mouth daily. 07/23/19 10/21/19  [place], Alvira Philips, DO  furosemide (LASIX) 40 MG tablet Take 1 tablet (40 mg total) by mouth daily. 07/24/19 10/22/19  [place], Alvira Philips, DO  metoprolol succinate (TOPROL-XL) 50 MG 24 hr tablet Take 1 tablet (50 mg total) by mouth daily. 07/24/19 10/22/19  [place], Alvira Philips, DO  nitroGLYCERIN (NITROSTAT) 0.4 MG SL tablet Place 1 tablet (0.4 mg total) under the tongue every 5 (five) minutes x 3 doses as needed for chest pain. 07/28/19   Ronney Asters, NP  sacubitril-valsartan (ENTRESTO) 49-51 MG Take 1 tablet by mouth 2 (two) times daily. 08/29/19   Ronney Asters, NP  spironolactone (ALDACTONE) 25 MG tablet Take 0.5 tablets (12.5 mg total) by mouth daily. 08/11/19 11/09/19  Ronney Asters, NP  ticagrelor Marden Noble)  90 MG TABS tablet Take 1 tablet (90 mg total) by mouth 2 (two) times daily. 08/29/19   Ronney Asters, NP    Family History    Family History  Problem Relation Age of Onset  . Heart disease Mother   . Emphysema Father    He indicated that his mother is deceased. He indicated that his father is deceased. He indicated that his sister is alive. He indicated that his brother is alive. He indicated that his maternal grandmother is deceased. He indicated that his maternal grandfather is deceased. He indicated that his paternal grandmother is deceased. He indicated that his paternal grandfather is deceased.  Social History    Social History   Socioeconomic History  . Marital status: Married    Spouse name: Not on file  . Number of  children: Not on file  . Years of education: Not on file  . Highest education level: Not on file  Occupational History  . Not on file  Tobacco Use  . Smoking status: Never Smoker  . Smokeless tobacco: Never Used  Substance and Sexual Activity  . Alcohol use: Not on file  . Drug use: No  . Sexual activity: Not on file  Other Topics Concern  . Not on file  Social History Narrative  . Not on file   Social Determinants of Health   Financial Resource Strain:   . Difficulty of Paying Living Expenses:   Food Insecurity:   . Worried About Programme researcher, broadcasting/film/video in the Last Year:   . Barista in the Last Year:   Transportation Needs:   . Freight forwarder (Medical):   [name] Lack of Transportation (Non-Medical):   Physical Activity:   . Days of Exercise per Week:   . Minutes of Exercise per Session:   Stress:   . Feeling of Stress :   Social Connections:   . Frequency of Communication with Friends and Family:   . Frequency of Social Gatherings with Friends and Family:   . Attends Religious Services:   . Active Member of Clubs or Organizations:   . Attends Banker Meetings:   [name] Marital Status:   Intimate Partner Violence:   . Fear of Current or Ex-Partner:   . Emotionally Abused:   [name] Physically Abused:   . Sexually Abused:      Review of Systems    General:  No chills, fever, night sweats or weight changes.  Cardiovascular:  No chest pain, dyspnea on exertion, edema, orthopnea, palpitations, paroxysmal nocturnal dyspnea. Dermatological: No rash, lesions/masses Respiratory: No cough, dyspnea Urologic: No hematuria, dysuria Abdominal:   No nausea, vomiting, diarrhea, bright red blood per rectum, melena, or hematemesis Neurologic:  No visual changes, wkns, changes in mental status. All other systems reviewed and are otherwise negative except as noted above.  Physical Exam    VS:  BP 106/64 (BP Location: Right Arm, Patient Position: Sitting, Cuff Size:  Normal)   Pulse 66   Temp 98.3 F (36.8 C)   Ht 6' (1.829 m)   Wt 181 lb (82.1 kg)   SpO2 99%   BMI 24.55 kg/m  , BMI Body mass index is 24.55 kg/m. GEN: Well nourished, well developed, in no acute distress. HEENT: normal. Neck: Supple, no JVD, carotid bruits, or masses. Cardiac: RRR, no murmurs, rubs, or gallops. No clubbing, cyanosis, edema.  Radials/DP/PT 2+ and equal bilaterally.  Respiratory:  Respirations regular and unlabored, clear to auscultation bilaterally. GI: Soft,  nontender, nondistended, BS + x 4. MS: no deformity or atrophy. Skin: warm and dry, no rash. Neuro:  Strength and sensation are intact. Psych: Normal affect.  Accessory Clinical Findings    ECG personally reviewed by me today-none today.  EKG 07/28/2019 normal sinus rhythm nonspecific T wave abnormality 75 bpm   Echocardiogram3/18/2021 IMPRESSIONS    1. Left ventricular ejection fraction, by estimation, is 20 to 25%. The  left ventricle has severely decreased function. The left ventricle  demonstrates global hypokinesis. The left ventricular internal cavity size  was mildly to moderately dilated. Left  ventricular diastolic function could not be evaluated. Left ventricular  diastolic function could not be evaluated.  2. Right ventricular systolic function is normal. The right ventricular  size is normal. Tricuspid regurgitation signal is inadequate for assessing  PA pressure.  3. Left atrial size was severely dilated.  4. The mitral valve is grossly normal. Mild to moderate mitral valve  regurgitation. No evidence of mitral stenosis.  5. The aortic valve is tricuspid. Aortic valve regurgitation is mild. No  aortic stenosis is present.  6. 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 Aortic dilatation noted. Aneurysm of  the ascending aorta, measuring 48 mm.  7. The inferior vena cava is dilated in size with <50% respiratory  variability, suggesting right atrial pressure of 15 mmHg.    Comparison(s): A prior study was performed on 02/13/2017. Prior images  unable to be directly viewed, comparison made by report only. Changes from  prior study are noted. The left ventricular function is significantly  worse. EF now severely reduced.  Ascending aortic aneurysm similar to last CTA.  Cardiac catheterization3/19/2021 Significant underlying two-vessel coronary artery disease with widely patent RCA stent with no significant restenosis. There is moderate mid RCA stenosis just distal to the previously placed stent. This does not appear to be flow-limiting. Progression of complex moderately calcified proximal to mid LAD disease involving 2 large diagonals which had significant ostial stenosis. 2. Severely reduced LV systolic function with an EF of 25%. Normal LVEDP at 8 mmHg. 3. Successful complex bifurcation PCI with balloon angioplasty of 2 ostial diagonal branches followed by drug-eluting stent placement to the proximal/mid LAD with excellent results.  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 Recommend aggressive medical therapy. Continue dual antiplatelet therapy for at least 12 months and preferably indefinitely. I switched metoprolol tartrate to metoprolol succinate given cardiomyopathy. If blood pressure allows, consider switching losartan to Entresto and adding spironolactone. The patient does not appear to be volume overloaded and I switch him from IV to oral furosemide. The burden of PVCs is concerning and might be contributing to his cardiomyopathy. Consider EP evaluation.  Diagnostic Dominance: Right  Intervention     Assessment & Plan   1.  Acute on chronic systolic CHF-euvolemictoday.No increased work of breathing. Echocardiogram 07/21/2019 showed a decrease in his LVEF from 40-45% to 20-25% with global hypokinesis. BP today106/64 Increase Entresto 97/103  Continue metoprolol  succinate 50 mg daily Continue furosemide 40 mg tablet daily Continuespironolactone 12.5 mg daily Heart healthy low-sodium diet Increase physical activity as tolerated Blood pressure log given. Repeat echocardiogram in 1 month. BMP in 2 weeks.  Coronary artery disease-no chest pain . Cardiac catheterization 07/22/2019 which showed proximal and mid LAD high-grade plaque with 85% stenosis. A successful bifurcation PCI with balloon angioplasty of 2 ostial diagonal branches was performed followed by DES x1 to his proximal/mid LAD. His previous RCA stents were widely patent.  Continue aspirin 81 mg daily Continue atorvastatin 80 mg tablet daily Continue metoprolol succinate 50 mg tablet daily Continue continue Brilinta 90 mg twice daily Heart healthy low-sodium diet Increase physical activity as tolerated Refill sublingual nitroglycerin  Hyperlipidemia-LDL 64 05/10/2018 Continue atorvastatin 80 mg tablet daily Heart healthy low-sodium high-fiber diet Increase physical activity as tolerated   Disposition: Follow-up with  Dr Jacques NavyAcharya or me in 4 weeks.   Thomasene RippleJesse M. Josephmichael Lisenbee NP-C    10/05/2019, 8:32 AM [facility] [address] Office [phone] Fax [phone]

## 2019-10-05 ENCOUNTER — Encounter: Payer: Self-pay | Admitting: General Practice

## 2019-10-05 ENCOUNTER — Ambulatory Visit: Payer: Medicare HMO | Admitting: General Practice

## 2019-10-05 ENCOUNTER — Other Ambulatory Visit: Payer: Self-pay

## 2019-10-05 VITALS — BP 106/64 | HR 66 | Temp 98.3°F | Ht 72.0 in | Wt 181.0 lb

## 2019-10-05 DIAGNOSIS — Z79899 Other long term (current) drug therapy: Secondary | ICD-10-CM | POA: Diagnosis not present

## 2019-10-05 DIAGNOSIS — I2510 Atherosclerotic heart disease of native coronary artery without angina pectoris: Secondary | ICD-10-CM

## 2019-10-05 DIAGNOSIS — E785 Hyperlipidemia, unspecified: Secondary | ICD-10-CM

## 2019-10-05 DIAGNOSIS — I5023 Acute on chronic systolic (congestive) heart failure: Secondary | ICD-10-CM | POA: Diagnosis not present

## 2019-10-05 MED ORDER — METOPROLOL SUCCINATE ER 50 MG PO TB24: 50.0000 mg | ORAL_TABLET | Freq: Every day | ORAL | 1 refills | Status: AC

## 2019-10-05 MED ORDER — ENTRESTO 97-103 MG PO TABS: 1.0000 | ORAL_TABLET | Freq: Two times a day (BID) | ORAL | 6 refills | Status: AC

## 2019-10-05 NOTE — Patient Instructions (Addendum)
Medication Instructions:  INCREASE ENTRESTO 97/103MG  TWICE DAILY *If you need a refill on your cardiac medications before your next appointment, please call your pharmacy*  Lab Work: BMET IN 2 WEEKS (10-19-2019) If you have labs (blood work) drawn today and your tests are completely normal, you will receive your results only by:  MyChart Message (if you have MyChart) OR A paper copy in the mail.  If you have any lab test that is abnormal or we need to change your treatment, we will call you to review the results. You may go to any [facility] that is convenient for you however, we do have a lab in our office that is able to assist you. You DO NOT need an appointment for our lab. The lab is open 8:00am and closes at 4:00pm. Lunch 12:45 - 1:45pm.  Follow-Up: Your next appointment:  4 week(s)  Either In Person or Virtual with You may see [name], MD Edd Fabian, FNP-C or one of the following Advanced Practice Providers on your designated Care Team:    Theodore Demark, PA-C  Joni Reining, DNP, ANP  Cadence Fransico Michael, NP  At [facility], you and your health needs are our priority.  As part of our continuing mission to provide you with exceptional heart care, we have created designated Provider Care Teams.  These Care Teams include your primary Cardiologist (physician) and Advanced Practice Providers (APPs -  Physician Assistants and Nurse Practitioners) who all work together to provide you with the care you need, when you need it.  We recommend signing up for the patient portal called "MyChart".  Sign up information is provided on this After Visit Summary.  MyChart is used to connect with patients for Virtual Visits (Telemedicine).  Patients are able to view lab/test results, encounter notes, upcoming appointments, etc.  Non-urgent messages can be sent to your provider as well.   To learn more about what you can do with MyChart, go to [email or web address].

## 2019-10-21 LAB — BASIC METABOLIC PANEL
BUN/Creatinine Ratio: 19 (ref 10–24)
BUN: 19 mg/dL (ref 8–27)
CO2: 22 mmol/L (ref 20–29)
Calcium: 9.2 mg/dL (ref 8.6–10.2)
Chloride: 103 mmol/L (ref 96–106)
Creatinine, Ser: 1 mg/dL (ref 0.76–1.27)
GFR calc Af Amer: 87 mL/min/{1.73_m2} (ref >59)
GFR calc non Af Amer: 75 mL/min/{1.73_m2} (ref >59)
Glucose: 92 mg/dL (ref 65–99)
Potassium: 4.2 mmol/L (ref 3.5–5.2)
Sodium: 142 mmol/L (ref 134–144)

## 2019-11-07 NOTE — Progress Notes (Signed)
Cardiology Office Note:    Date:  11/08/2019   ID:  Patrick SenderSteven W Coots, DOB 11/26/1945, MRN [id number]  PCP:  Patient, No Pcp Per  Cardiologist:  Parke PoissonGayatri A Raylene Carmickle, MD  Electrophysiologist:  None   Referring MD: No ref. provider found  Chief Complaint: f/u chronic systolic HF, ICM  History of Present Illness:    Patrick Weber is a 74 y.o. male with a history of prior inferior STEMI in 2018 with RCA stent x 2, recent hospitalization for chest pain and acute on chronic systolic HF with drop in EF, subsequently found to have high grade proximal and mid LAD disease now s/p PCI, with patent RCA stent from prior. He has been followed closely since discharge by my colleague Edd FabianJesse Cleaver NP, and HF therapy has been optimized, with follow up echocardiogram pending. History also includes thoracic aortic aneursym, HTN and HLD.   Current medical therapy includes recently increased dose of Entresto 97-103 mg, metoprolol succinate 50 mg daily, lasix 40 mg daily, spironolactone 12.5 mg daily. For CAD he is also on ASA 81 mg daily, Brilinta 90 mg BID (1 year of DAPT will be completed on 07/21/20), atorvastatin 80 mg daily, SL nitro.   Today, he feels well, and is tolerating medical therapy well.  He takes his blood pressure in the morning around 5:30 AM and it is usually 110 to 120 mmHg systolic.  He takes his furosemide and his spironolactone around midday, and feels he is maintaining euvolemia.  He denies chest pain, shortness of breath, palpitations, PND, orthopnea, leg swelling.  Has no syncope or lightheadedness.  Currently describes NYHA class I symptoms.   Past Medical History:  Diagnosis Date  . Acute ST elevation myocardial infarction (STEMI) of inferior wall (HCC) 09/18/2016  . 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   . Hyperlipemia     Past Surgical  History:  Procedure Laterality Date  . CORONARY BALLOON ANGIOPLASTY N/A 07/22/2019   Procedure: CORONARY BALLOON ANGIOPLASTY;  Surgeon: Iran OuchArida, Muhammad A, MD;  Location: [facility];  Service: Cardiovascular;  Laterality: N/A;  . CORONARY STENT INTERVENTION N/A 07/22/2019   Procedure: CORONARY STENT INTERVENTION;  Surgeon: Iran OuchArida, Muhammad A, MD;  Location: [facility];  Service: Cardiovascular;  Laterality: N/A;  . LEFT HEART CATH AND CORONARY ANGIOGRAPHY N/A 09/18/2016   Procedure: Left Heart Cath and Coronary Angiography;  Surgeon: Yvonne KendallEnd, Christopher, MD;  Location: [facility];  Service: Cardiovascular;  Laterality: N/A;  . LEFT HEART CATH AND CORONARY ANGIOGRAPHY N/A 07/22/2019   Procedure: LEFT HEART CATH AND CORONARY ANGIOGRAPHY;  Surgeon: Iran OuchArida, Muhammad A, MD;  Location: [facility];  Service: Cardiovascular;  Laterality: N/A;    Current Medications: Current Meds  Medication Sig  . atorvastatin (LIPITOR) 80 MG tablet Take 1 tablet (80 mg total) by mouth daily.  [name]. ezetimibe (ZETIA) 10 MG tablet Take 1 tablet (10 mg total) by mouth daily.  . furosemide (LASIX) 40 MG tablet Take 1 tablet (40 mg total) by mouth daily.  . metoprolol succinate (TOPROL-XL) 50 MG 24 hr tablet Take 1 tablet (50 mg total) by mouth daily.  . nitroGLYCERIN (NITROSTAT) 0.4 MG SL tablet Place 1 tablet (0.4 mg total) under the tongue every 5 (five) minutes x 3 doses as needed for chest pain.  . sacubitril-valsartan (ENTRESTO) 97-103 MG Take 1 tablet  by mouth 2 (two) times daily.  [name] spironolactone (ALDACTONE) 25 MG tablet Take 12.5 mg by mouth daily.  . ticagrelor (BRILINTA) 90 MG TABS tablet Take 1 tablet (90 mg total) by mouth 2 (two) times daily.     Allergies:   Lidocaine   Social History   Socioeconomic History  . Marital status: Married    Spouse name: Not on file  . Number of children: Not on file  . Years of education: Not on file  . Highest education level: Not on file    Occupational History  . Not on file  Tobacco Use  . Smoking status: Never Smoker  . Smokeless tobacco: Never Used  Vaping Use  . Vaping Use: Never used  Substance and Sexual Activity  . Alcohol use: Not on file  . Drug use: No  . Sexual activity: Not on file  Other Topics Concern  . Not on file  Social History Narrative  . Not on file   Social Determinants of Health   Financial Resource Strain:   . Difficulty of Paying Living Expenses:   Food Insecurity:   . Worried About Programme researcher, broadcasting/film/video in the Last Year:   . Barista in the Last Year:   Transportation Needs:   . Freight forwarder (Medical):   [name] Lack of Transportation (Non-Medical):   Physical Activity:   . Days of Exercise per Week:   . Minutes of Exercise per Session:   Stress:   . Feeling of Stress :   Social Connections:   . Frequency of Communication with Friends and Family:   . Frequency of Social Gatherings with Friends and Family:   . Attends Religious Services:   . Active Member of Clubs or Organizations:   . Attends Banker Meetings:   [name] Marital Status:      Family History: The patient's family history includes Emphysema in his father; Heart disease in his mother.  ROS:   Please see the history of present illness.    All other systems reviewed and are negative.  EKGs/Labs/Other Studies Reviewed:    The following studies were reviewed today:  EKG: Not obtained today  I have independently reviewed the images from coronary angiogram 07/22/19.   Recent Labs: 07/21/2019: B Natriuretic Peptide 645.2; Magnesium 2.0 07/23/2019: Hemoglobin 14.5; Platelets 162 10/21/2019: BUN 19; Creatinine, Ser 1.00; Potassium 4.2; Sodium 142  Recent Lipid Panel    Component Value Date/Time   CHOL 124 05/10/2018 0744   TRIG 81 05/10/2018 0744   HDL 44 05/10/2018 0744   CHOLHDL 2.8 05/10/2018 0744   CHOLHDL 4.4 09/19/2016 0041   VLDL 14 09/19/2016 0041   LDLCALC 64 05/10/2018 0744     Physical Exam:    VS:  BP 126/74   Pulse 68   Ht 6\' 1"  (1.854 m)   Wt 188 lb 9.6 oz (85.5 kg)   SpO2 100%   BMI 24.88 kg/m     Wt Readings from Last 5 Encounters:  11/08/19 188 lb 9.6 oz (85.5 kg)  10/05/19 181 lb (82.1 kg)  09/13/19 177 lb (80.3 kg)  08/29/19 179 lb (81.2 kg)  08/11/19 177 lb 6.4 oz (80.5 kg)     Constitutional: No acute distress Eyes: sclera non-icteric, normal conjunctiva and lids ENMT: Mask in place Cardiovascular: regular rhythm, normal rate, no murmurs. S1 and S2 normal. Radial pulses normal bilaterally. No jugular venous distention.  Respiratory: clear to auscultation bilaterally GI : normal bowel sounds,  soft and nontender. No distention.   MSK: extremities warm, well perfused. No edema.  NEURO: grossly nonfocal exam, moves all extremities. PSYCH: alert and oriented x 3, normal mood and affect.   ASSESSMENT:    1. Acute on chronic systolic congestive heart failure (HCC)   2. Ischemic cardiomyopathy   3. Coronary artery disease involving native coronary artery of native heart without angina pectoris   4. Medication management   5. Essential hypertension   6. Hyperlipidemia LDL goal <70   7. Thoracic aortic aneurysm without rupture (HCC)    PLAN:    Acute on chronic systolic congestive heart failure (HCC) Ischemic cardiomyopathy - Plan: ECHOCARDIOGRAM COMPLETE  Plan for follow-up echocardiogram to reevaluate LV function after heart failure therapy.  He is tolerating Entresto, spironolactone, metoprolol succinate well.  NYHA class I symptoms at this time.  Coronary artery disease involving native coronary artery of native heart without angina pectoris Continue aspirin and Brilinta.  Aspirin will be indefinite, Brilinta can be considered for cessation in March 2022.  Essential hypertension-continue metoprolol, Entresto, spironolactone.  Blood pressure is well controlled.  Hyperlipidemia LDL goal <70-continue atorvastatin and Zetia, LDL  optimized.  Thoracic aortic aneurysm without rupture (HCC)-requires routine follow-up of stable thoracic aortic aneurysm, CTA ordered and pending.  Last measurement 47 mm at sinus of Valsalva.   Total time of encounter: 30 minutes total time of encounter, including 18 minutes spent in face-to-face patient care on the date of this encounter. This time includes coordination of care and counseling regarding above mentioned problem list. Remainder of non-face-to-face time involved reviewing chart documents/testing relevant to the patient encounter and documentation in the medical record. I have independently reviewed documentation from referring provider.   Weston Brass, MD [place]  [facility]    Medication Adjustments/Labs and Tests Ordered: Current medicines are reviewed at length with the patient today.  Concerns regarding medicines are outlined above.  Orders Placed This Encounter  Procedures  . ECHOCARDIOGRAM COMPLETE   No orders of the defined types were placed in this encounter.   Patient Instructions  Medication Instructions:  Your Physician recommend you continue on your current medication as directed.    *If you need a refill on your cardiac medications before your next appointment, please call your pharmacy*   Lab Work: None   Testing/Procedures: Your physician has requested that you have an echocardiogram. Echocardiography is a painless test that uses sound waves to create images of your heart. It provides your doctor with information about the size and shape of your heart and how well your heart's chambers and valves are working. This procedure takes approximately one hour. There are no restrictions for this procedure. [address]    Follow-Up: At [facility], you and your health needs are our priority.  As part of our continuing mission to provide you with exceptional heart care, we have created designated Provider Care Teams.  These  Care Teams include your primary Cardiologist (physician) and Advanced Practice Providers (APPs -  Physician Assistants and Nurse Practitioners) who all work together to provide you with the care you need, when you need it.  We recommend signing up for the patient portal called "MyChart".  Sign up information is provided on this After Visit Summary.  MyChart is used to connect with patients for Virtual Visits (Telemedicine).  Patients are able to view lab/test results, encounter notes, upcoming appointments, etc.  Non-urgent messages can be sent to your provider as well.  To learn more about what you can do with MyChart, go to [email or web address].    Your next appointment:   3 month(s)  The format for your next appointment:   In Person  Provider:   Weston Brass, MD

## 2019-11-08 ENCOUNTER — Encounter: Payer: Self-pay | Admitting: Internal Medicine

## 2019-11-08 ENCOUNTER — Ambulatory Visit: Payer: Medicare HMO | Admitting: Internal Medicine

## 2019-11-08 ENCOUNTER — Other Ambulatory Visit: Payer: Self-pay

## 2019-11-08 VITALS — BP 126/74 | HR 68 | Ht 73.0 in | Wt 188.6 lb

## 2019-11-08 DIAGNOSIS — I10 Essential (primary) hypertension: Secondary | ICD-10-CM

## 2019-11-08 DIAGNOSIS — Z79899 Other long term (current) drug therapy: Secondary | ICD-10-CM

## 2019-11-08 DIAGNOSIS — I2510 Atherosclerotic heart disease of native coronary artery without angina pectoris: Secondary | ICD-10-CM | POA: Diagnosis not present

## 2019-11-08 DIAGNOSIS — I255 Ischemic cardiomyopathy: Secondary | ICD-10-CM

## 2019-11-08 DIAGNOSIS — I5023 Acute on chronic systolic (congestive) heart failure: Secondary | ICD-10-CM | POA: Diagnosis not present

## 2019-11-08 DIAGNOSIS — I712 Thoracic aortic aneurysm, without rupture, unspecified: Secondary | ICD-10-CM

## 2019-11-08 DIAGNOSIS — E785 Hyperlipidemia, unspecified: Secondary | ICD-10-CM

## 2019-11-08 NOTE — Patient Instructions (Signed)
Medication Instructions:  Your Physician recommend you continue on your current medication as directed.    *If you need a refill on your cardiac medications before your next appointment, please call your pharmacy*   Lab Work: None   Testing/Procedures: Your physician has requested that you have an echocardiogram. Echocardiography is a painless test that uses sound waves to create images of your heart. It provides your doctor with information about the size and shape of your heart and how well your heart's chambers and valves are working. This procedure takes approximately one hour. There are no restrictions for this procedure. [address]    Follow-Up: At [facility], you and your health needs are our priority.  As part of our continuing mission to provide you with exceptional heart care, we have created designated Provider Care Teams.  These Care Teams include your primary Cardiologist (physician) and Advanced Practice Providers (APPs -  Physician Assistants and Nurse Practitioners) who all work together to provide you with the care you need, when you need it.  We recommend signing up for the patient portal called "MyChart".  Sign up information is provided on this After Visit Summary.  MyChart is used to connect with patients for Virtual Visits (Telemedicine).  Patients are able to view lab/test results, encounter notes, upcoming appointments, etc.  Non-urgent messages can be sent to your provider as well.   To learn more about what you can do with MyChart, go to [email or web address].    Your next appointment:   3 month(s)  The format for your next appointment:   In Person  Provider:   Weston Brass, MD

## 2019-11-16 ENCOUNTER — Other Ambulatory Visit: Payer: Self-pay

## 2019-11-16 ENCOUNTER — Ambulatory Visit (INDEPENDENT_AMBULATORY_CARE_PROVIDER_SITE_OTHER)
Admission: RE | Admit: 2019-11-16 | Discharge: 2019-11-16 | Disposition: A | Discharge status: Home / Self Care | Payer: Medicare HMO | Source: Ambulatory Visit | Attending: Internal Medicine | Admitting: Internal Medicine

## 2019-11-16 DIAGNOSIS — I712 Thoracic aortic aneurysm, without rupture, unspecified: Secondary | ICD-10-CM

## 2019-11-16 MED ORDER — IOHEXOL 350 MG/ML SOLN
100.0000 mL | Freq: Once | INTRAVENOUS | Status: AC | PRN
  Administered 2019-11-16: 100 mL via INTRAVENOUS

## 2019-11-24 ENCOUNTER — Inpatient Hospital Stay: Admission: RE | Admit: 2019-11-24 | Payer: Medicare HMO | Source: Ambulatory Visit

## 2019-11-24 ENCOUNTER — Ambulatory Visit (HOSPITAL_COMMUNITY): Payer: Medicare HMO | Attending: Cardiology

## 2019-11-24 ENCOUNTER — Other Ambulatory Visit: Payer: Self-pay

## 2019-11-24 DIAGNOSIS — I255 Ischemic cardiomyopathy: Secondary | ICD-10-CM | POA: Diagnosis not present

## 2019-11-24 LAB — ECHOCARDIOGRAM COMPLETE
Area-P 1/2: 2.56 cm2
P 1/2 time: 618 msec
S' Lateral: 4.3 cm

## 2019-12-26 ENCOUNTER — Telehealth: Payer: Self-pay | Admitting: Internal Medicine

## 2019-12-26 NOTE — Telephone Encounter (Signed)
*  STAT* If patient is at the pharmacy, call can be transferred to refill team.   1. Which medications need to be refilled? (please list name of each medication and dose if known) furosemide (LASIX) 40 MG tablet(Expired)  2. Which pharmacy/location (including street and city if local pharmacy) is medication to be sent to? [facility] [address], [place] - [address]  3. Do they need a 30 day or 90 day supply? 90

## 2019-12-27 ENCOUNTER — Other Ambulatory Visit: Payer: Self-pay

## 2019-12-27 MED ORDER — FUROSEMIDE 40 MG PO TABS: 40.0000 mg | ORAL_TABLET | Freq: Every day | ORAL | 0 refills | Status: AC

## 2020-01-13 ENCOUNTER — Other Ambulatory Visit: Payer: Self-pay

## 2020-01-13 ENCOUNTER — Ambulatory Visit (INDEPENDENT_AMBULATORY_CARE_PROVIDER_SITE_OTHER): Payer: Medicare HMO | Admitting: Nurse Practitioner

## 2020-01-13 ENCOUNTER — Other Ambulatory Visit: Payer: Self-pay | Admitting: Nurse Practitioner

## 2020-01-13 ENCOUNTER — Encounter: Payer: Self-pay | Admitting: Nurse Practitioner

## 2020-01-13 VITALS — BP 121/81 | HR 93 | Temp 97.4°F | Resp 20 | Ht 72.0 in | Wt 175.0 lb

## 2020-01-13 DIAGNOSIS — R300 Dysuria: Secondary | ICD-10-CM | POA: Insufficient documentation

## 2020-01-13 DIAGNOSIS — E782 Mixed hyperlipidemia: Secondary | ICD-10-CM

## 2020-01-13 DIAGNOSIS — N5089 Other specified disorders of the male genital organs: Secondary | ICD-10-CM

## 2020-01-13 DIAGNOSIS — Z0000 Encounter for general adult medical examination without abnormal findings: Secondary | ICD-10-CM | POA: Insufficient documentation

## 2020-01-13 DIAGNOSIS — I10 Essential (primary) hypertension: Secondary | ICD-10-CM

## 2020-01-13 DIAGNOSIS — Z0001 Encounter for general adult medical examination with abnormal findings: Secondary | ICD-10-CM

## 2020-01-13 LAB — URINALYSIS, MICROSCOPIC ONLY: WBC, UA: >30 /hpf — AB (ref 0–5)

## 2020-01-13 MED ORDER — ACETAMINOPHEN 500 MG PO TABS: 500.0000 mg | ORAL_TABLET | Freq: Four times a day (QID) | ORAL | 0 refills | Status: AC | PRN

## 2020-01-13 MED ORDER — NITROFURANTOIN MONOHYD MACRO 100 MG PO CAPS: 100.0000 mg | ORAL_CAPSULE | Freq: Two times a day (BID) | ORAL | 0 refills | Status: DC

## 2020-01-13 NOTE — Assessment & Plan Note (Signed)
Patient is a 74 year old male who presents to clinic to establish care and physical exam. Head to toe assessment completed. Provided education to patient for preventative care, health maintenance. Advised patient to complete pneumonia vaccine, Covid vaccine, and colonoscopy.  Referrals completed. Obtain lab work-CBC, CMP, lipids and urinalysis.

## 2020-01-13 NOTE — Addendum Note (Signed)
Addended by: Daryll Drown on: 01/13/2020 04:14 PM   Modules accepted: Orders

## 2020-01-13 NOTE — Patient Instructions (Signed)
High Cholesterol ° °High cholesterol is a condition in which the blood has high levels of a white, waxy, fat-like substance (cholesterol). The human body needs small amounts of cholesterol. The liver makes all the cholesterol that the body needs. Extra (excess) cholesterol comes from the food that we eat. °Cholesterol is carried from the liver by the blood through the blood vessels. If you have high cholesterol, deposits (plaques) may build up on the walls of your blood vessels (arteries). Plaques make the arteries narrower and stiffer. Cholesterol plaques increase your risk for heart attack and stroke. Work with your health care provider to keep your cholesterol levels in a healthy range. °What increases the risk? °This condition is more likely to develop in people who: °· Eat foods that are high in animal fat (saturated fat) or cholesterol. °· Are overweight. °· Are not getting enough exercise. °· Have a family history of high cholesterol. °What are the signs or symptoms? °There are no symptoms of this condition. °How is this diagnosed? °This condition may be diagnosed from the results of a blood test. °· If you are older than age 20, your health care provider may check your cholesterol every 4-6 years. °· You may be checked more often if you already have high cholesterol or other risk factors for heart disease. °The blood test for cholesterol measures: °· "Bad" cholesterol (LDL cholesterol). This is the main type of cholesterol that causes heart disease. The desired level for LDL is less than 100. °· "Good" cholesterol (HDL cholesterol). This type helps to protect against heart disease by cleaning the arteries and carrying the LDL away. The desired level for HDL is 60 or higher. °· Triglycerides. These are fats that the body can store or burn for energy. The desired number for triglycerides is lower than 150. °· Total cholesterol. This is a measure of the total amount of cholesterol in your blood, including LDL  cholesterol, HDL cholesterol, and triglycerides. A healthy number is less than 200. °How is this treated? °This condition is treated with diet changes, lifestyle changes, and medicines. °Diet changes °· This may include eating more whole grains, fruits, vegetables, nuts, and fish. °· This may also include cutting back on red meat and foods that have a lot of added sugar. °Lifestyle changes °· Changes may include getting at least 40 minutes of aerobic exercise 3 times a week. Aerobic exercises include walking, biking, and swimming. Aerobic exercise along with a healthy diet can help you maintain a healthy weight. °· Changes may also include quitting smoking. °Medicines °· Medicines are usually given if diet and lifestyle changes have failed to reduce your cholesterol to healthy levels. °· Your health care provider may prescribe a statin medicine. Statin medicines have been shown to reduce cholesterol, which can reduce the risk of heart disease. °Follow these instructions at home: °Eating and drinking °If told by your health care provider: °· Eat chicken (without skin), fish, veal, shellfish, ground turkey breast, and round or loin cuts of red meat. °· Do not eat fried foods or fatty meats, such as hot dogs and salami. °· Eat plenty of fruits, such as apples. °· Eat plenty of vegetables, such as broccoli, potatoes, and carrots. °· Eat beans, peas, and lentils. °· Eat grains such as barley, rice, couscous, and bulgur wheat. °· Eat pasta without cream sauces. °· Use skim or nonfat milk, and eat low-fat or nonfat yogurt and cheeses. °· Do not eat or drink whole milk, cream, ice cream, egg yolks,   or hard cheeses.  Do not eat stick margarine or tub margarines that contain trans fats (also called partially hydrogenated oils).  Do not eat saturated tropical oils, such as coconut oil and palm oil.  Do not eat cakes, cookies, crackers, or other baked goods that contain trans fats.  General instructions  Exercise as  directed by your health care provider. Increase your activity level with activities such as gardening, walking, and taking the stairs.  Take over-the-counter and prescription medicines only as told by your health care provider.  Do not use any products that contain nicotine or tobacco, such as cigarettes and e-cigarettes. If you need help quitting, ask your health care provider.  Keep all follow-up visits as told by your health care provider. This is important. Contact a health care provider if:  You are struggling to maintain a healthy diet or weight.  You need help to start on an exercise program.  You need help to stop smoking. Get help right away if:  You have chest pain.  You have trouble breathing. This information is not intended to replace advice given to you by your health care provider. Make sure you discuss any questions you have with your health care provider. Document Revised: 04/24/2017 Document Reviewed: 10/20/2015 Elsevier Patient Education  2020 [employer] Hypertension, Adult Hypertension is another name for high blood pressure. High blood pressure forces your heart to work harder to pump blood. This can cause problems over time. There are two numbers in a blood pressure reading. There is a top number (systolic) over a bottom number (diastolic). It is best to have a blood pressure that is below 120/80. Healthy choices can help lower your blood pressure, or you may need medicine to help lower it. What are the causes? The cause of this condition is not known. Some conditions may be related to high blood pressure. What increases the risk?  Smoking.  Having type 2 diabetes mellitus, high cholesterol, or both.  Not getting enough exercise or physical activity.  Being overweight.  Having too much fat, sugar, calories, or salt (sodium) in your diet.  Drinking too much alcohol.  Having long-term (chronic) kidney disease.  Having a family history of high blood  pressure.  Age. Risk increases with age.  Race. You may be at higher risk if you are African American.  Gender. Men are at higher risk than women before age 73. After age 73, women are at higher risk than men.  Having obstructive sleep apnea.  Stress. What are the signs or symptoms?  High blood pressure may not cause symptoms. Very high blood pressure (hypertensive crisis) may cause: ? Headache. ? Feelings of worry or nervousness (anxiety). ? Shortness of breath. ? Nosebleed. ? A feeling of being sick to your stomach (nausea). ? Throwing up (vomiting). ? Changes in how you see. ? Very bad chest pain. ? Seizures. How is this treated?  This condition is treated by making healthy lifestyle changes, such as: ? Eating healthy foods. ? Exercising more. ? Drinking less alcohol.  Your health care provider may prescribe medicine if lifestyle changes are not enough to get your blood pressure under control, and if: ? Your top number is above 130. ? Your bottom number is above 80.  Your personal target blood pressure may vary. Follow these instructions at home: Eating and drinking   If told, follow the DASH eating plan. To follow this plan: ? Fill one half of your plate at each meal with fruits and  vegetables. ? Fill one fourth of your plate at each meal with whole grains. Whole grains include whole-wheat pasta, brown rice, and whole-grain bread. ? Eat or drink low-fat dairy products, such as skim milk or low-fat yogurt. ? Fill one fourth of your plate at each meal with low-fat (lean) proteins. Low-fat proteins include fish, chicken without skin, eggs, beans, and tofu. ? Avoid fatty meat, cured and processed meat, or chicken with skin. ? Avoid pre-made or processed food.  Eat less than 1,500 mg of salt each day.  Do not drink alcohol if: ? Your doctor tells you not to drink. ? You are pregnant, may be pregnant, or are planning to become pregnant.  If you drink  alcohol: ? Limit how much you use to:  0-1 drink a day for women.  0-2 drinks a day for men. ? Be aware of how much alcohol is in your drink. In the [place] one drink equals one 12 oz bottle of beer (355 mL), one 5 oz glass of wine (148 mL), or one 1 oz glass of hard liquor (44 mL). Lifestyle   Work with your doctor to stay at a healthy weight or to lose weight. Ask your doctor what the best weight is for you.  Get at least 30 minutes of exercise most days of the week. This may include walking, swimming, or biking.  Get at least 30 minutes of exercise that strengthens your muscles (resistance exercise) at least 3 days a week. This may include lifting weights or doing Pilates.  Do not use any products that contain nicotine or tobacco, such as cigarettes, e-cigarettes, and chewing tobacco. If you need help quitting, ask your doctor.  Check your blood pressure at home as told by your doctor.  Keep all follow-up visits as told by your doctor. This is important. Medicines  Take over-the-counter and prescription medicines only as told by your doctor. Follow directions carefully.  Do not skip doses of blood pressure medicine. The medicine does not work as well if you skip doses. Skipping doses also puts you at risk for problems.  Ask your doctor about side effects or reactions to medicines that you should watch for. Contact a doctor if you:  Think you are having a reaction to the medicine you are taking.  Have headaches that keep coming back (recurring).  Feel dizzy.  Have swelling in your ankles.  Have trouble with your vision. Get help right away if you:  Get a very bad headache.  Start to feel mixed up (confused).  Feel weak or numb.  Feel faint.  Have very bad pain in your: ? Chest. ? Belly (abdomen).  Throw up more than once.  Have trouble breathing. Summary  Hypertension is another name for high blood pressure.  High blood pressure forces your heart to work  harder to pump blood.  For most people, a normal blood pressure is less than 120/80.  Making healthy choices can help lower blood pressure. If your blood pressure does not get lower with healthy choices, you may need to take medicine. This information is not intended to replace advice given to you by your health care provider. Make sure you discuss any questions you have with your health care provider. Document Revised: 12/30/2017 Document Reviewed: 12/30/2017 Elsevier Patient Education  2020 [employer]. Health Maintenance After Age 26 After age 83, you are at a higher risk for certain long-term diseases and infections as well as injuries from falls. Falls are a major cause  of broken bones and head injuries in people who are older than age 6. Getting regular preventive care can help to keep you healthy and well. Preventive care includes getting regular testing and making lifestyle changes as recommended by your health care provider. Talk with your health care provider about:  Which screenings and tests you should have. A screening is a test that checks for a disease when you have no symptoms.  A diet and exercise plan that is right for you. What should I know about screenings and tests to prevent falls? Screening and testing are the best ways to find a health problem early. Early diagnosis and treatment give you the best chance of managing medical conditions that are common after age 79. Certain conditions and lifestyle choices may make you more likely to have a fall. Your health care provider may recommend:  Regular vision checks. Poor vision and conditions such as cataracts can make you more likely to have a fall. If you wear glasses, make sure to get your prescription updated if your vision changes.  Medicine review. Work with your health care provider to regularly review all of the medicines you are taking, including over-the-counter medicines. Ask your health care provider about any side  effects that may make you more likely to have a fall. Tell your health care provider if any medicines that you take make you feel dizzy or sleepy.  Osteoporosis screening. Osteoporosis is a condition that causes the bones to get weaker. This can make the bones weak and cause them to break more easily.  Blood pressure screening. Blood pressure changes and medicines to control blood pressure can make you feel dizzy.  Strength and balance checks. Your health care provider may recommend certain tests to check your strength and balance while standing, walking, or changing positions.  Foot health exam. Foot pain and numbness, as well as not wearing proper footwear, can make you more likely to have a fall.  Depression screening. You may be more likely to have a fall if you have a fear of falling, feel emotionally low, or feel unable to do activities that you used to do.  Alcohol use screening. Using too much alcohol can affect your balance and may make you more likely to have a fall. What actions can I take to lower my risk of falls? General instructions  Talk with your health care provider about your risks for falling. Tell your health care provider if: ? You fall. Be sure to tell your health care provider about all falls, even ones that seem minor. ? You feel dizzy, sleepy, or off-balance.  Take over-the-counter and prescription medicines only as told by your health care provider. These include any supplements.  Eat a healthy diet and maintain a healthy weight. A healthy diet includes low-fat dairy products, low-fat (lean) meats, and fiber from whole grains, beans, and lots of fruits and vegetables. Home safety  Remove any tripping hazards, such as rugs, cords, and clutter.  Install safety equipment such as grab bars in bathrooms and safety rails on stairs.  Keep rooms and walkways well-lit. Activity   Follow a regular exercise program to stay fit. This will help you maintain your  balance. Ask your health care provider what types of exercise are appropriate for you.  If you need a cane or walker, use it as recommended by your health care provider.  Wear supportive shoes that have nonskid soles. Lifestyle  Do not drink alcohol if your health care provider tells  you not to drink.  If you drink alcohol, limit how much you have: ? 0-1 drink a day for women. ? 0-2 drinks a day for men.  Be aware of how much alcohol is in your drink. In the [place] one drink equals one typical bottle of beer (12 oz), one-half glass of wine (5 oz), or one shot of hard liquor (1 oz).  Do not use any products that contain nicotine or tobacco, such as cigarettes and e-cigarettes. If you need help quitting, ask your health care provider. Summary  Having a healthy lifestyle and getting preventive care can help to protect your health and wellness after age 80.  Screening and testing are the best way to find a health problem early and help you avoid having a fall. Early diagnosis and treatment give you the best chance for managing medical conditions that are more common for people who are older than age 18.  Falls are a major cause of broken bones and head injuries in people who are older than age 19. Take precautions to prevent a fall at home.  Work with your health care provider to learn what changes you can make to improve your health and wellness and to prevent falls. This information is not intended to replace advice given to you by your health care provider. Make sure you discuss any questions you have with your health care provider. Document Revised: 08/12/2018 Document Reviewed: 03/04/2017 Elsevier Patient Education  2020 [employer].

## 2020-01-13 NOTE — Assessment & Plan Note (Signed)
Patient is a 74 year old male who presents to clinic today to establish care. After assessment patient is noted with enlarged scrotum. Patient reports that this has been present in the last few weeks. Nothing has been done to alleviate symptoms. Patient is not reporting any pain but slight pressure with urinary retention. Provided education to patient with printed handout Referral to urology and surgical consult completed. Urinalysis obtained and completed which shows copious amount of bacteria and elevated white blood count.

## 2020-01-13 NOTE — Progress Notes (Signed)
New Patient Office Visit  Subjective:  Patient ID: Patrick Weber, male    DOB: [birth year]  Age: 74 y.o. MRN: [id number]  CC:  Chief Complaint  Patient presents with   Establish Care    NEW     HPI [name] presents for .   Encounter for general adult medical examination without abnormal findings  Physical: Patient's last physical exam was few years ago .  Weight: Appropriate for height (BMI less than 27%) ; 23.73 kg/m Blood Pressure: Normal (BP less than 120/80) ;  Medical History: Patient history reviewed ; Family history reviewed ;  Allergies Reviewed: No change in current allergies ;  Medications Reviewed: Medications reviewed - no changes ;  Lipids: Normal lipid levels ;  Smoking: Life-long non-smoker ;  Physical Activity: Exercises at least 3 times per week ;  Alcohol/Drug Use: Is a non-drinker ; No illicit drug use ;  Patient is not afflicted from Stress Incontinence and Urge Incontinence  Safety: reviewed ; Patient wears a seat belt, has smoke detectors, has carbon monoxide detectors, practices appropriate gun safety, and wears sunscreen with extended sun exposure. Dental Care: No , will schedule appointment, brushes daily. (Missing lower teeth) Ophthalmology/Optometry: visit as needed. (cateract surgery) in the past Hearing loss: none Vision impairments: none  Past Medical History:  Diagnosis Date   Acute ST elevation myocardial infarction (STEMI) of inferior wall (HCC) 09/18/2016   Coronary artery disease involving native coronary artery of native heart with unstable angina pectoris (HCC) 09/18/2016   100% thrombotic RCA occlusion - DES x 2 with distal embolization.    Hyperlipemia     Past Surgical History:  Procedure Laterality Date   CORONARY BALLOON ANGIOPLASTY N/A 07/22/2019   Procedure: CORONARY BALLOON ANGIOPLASTY;  Surgeon: [name], MD;  Location: [facility];  Service: Cardiovascular;  Laterality: N/A;   CORONARY STENT  INTERVENTION N/A 07/22/2019   Procedure: CORONARY STENT INTERVENTION;  Surgeon: [name], MD;  Location: [facility];  Service: Cardiovascular;  Laterality: N/A;   LEFT HEART CATH AND CORONARY ANGIOGRAPHY N/A 09/18/2016   Procedure: Left Heart Cath and Coronary Angiography;  Surgeon: [name], MD;  Location: [facility];  Service: Cardiovascular;  Laterality: N/A;   LEFT HEART CATH AND CORONARY ANGIOGRAPHY N/A 07/22/2019   Procedure: LEFT HEART CATH AND CORONARY ANGIOGRAPHY;  Surgeon: [name], MD;  Location: [facility];  Service: Cardiovascular;  Laterality: N/A;    Family History  Problem Relation Age of Onset   Heart disease Mother        heart valve problem at age 66   Emphysema Father    Other Son        enlarged aorta     Social History   Socioeconomic History   Marital status: Widowed    Spouse name: Not on file   Number of children: 4   Years of education: Not on file   Highest education level: Not on file  Occupational History   Not on file  Tobacco Use   Smoking status: Never Smoker   Smokeless tobacco: Never Used  Vaping Use   Vaping Use: Never used  Substance and Sexual Activity   Alcohol use: Not Currently   Drug use: No   Sexual activity: Not on file  Other Topics Concern   Not on file  Social History Narrative   Lives at home - youngest son lives with him    Social  Determinants of Health                                                                         ROS Review of Systems  Genitourinary: Positive for difficulty urinating, dysuria and scrotal swelling.       Inguinal hernia  All other systems reviewed and are negative.   Objective:   Today's Vitals: BP 121/81    Pulse 93    Temp (!) 97.4 F (36.3 C)    Resp 20    Ht 6' (1.829 m)    Wt 175 lb (79.4 kg)    SpO2 99%    BMI 23.73 kg/m   Physical Exam Constitutional:      Appearance: Normal appearance. He  is normal weight.  HENT:     Right Ear: External ear normal. There is no impacted cerumen.     Left Ear: External ear normal. There is impacted cerumen.     Nose: Nose normal.     Mouth/Throat:     Mouth: Mucous membranes are moist.     Pharynx: Oropharynx is clear.  Eyes:     Conjunctiva/sclera: Conjunctivae normal.  Cardiovascular:     Rate and Rhythm: Normal rate.     Pulses: Normal pulses.     Heart sounds: Normal heart sounds.  Pulmonary:     Effort: Pulmonary effort is normal.     Breath sounds: Normal breath sounds.  Abdominal:     General: Bowel sounds are normal.     Hernia: A hernia is present.  Genitourinary:    Pubic Area: No rash.      Testes:        Left: Swelling present. Tenderness not present.     Tanner stage (genital): 5.       Comments: Large inguinal hernia, enlarged scrotum. Musculoskeletal:        General: Tenderness present.     Cervical back: Tenderness present.  Skin:    General: Skin is dry.  Neurological:     Mental Status: He is alert and oriented to person, place, and time.  Psychiatric:        Mood and Affect: Mood normal.        Behavior: Behavior normal.     Assessment & Plan:   Problem List Items Addressed This Visit      Cardiovascular and Mediastinum   Essential hypertension   Relevant Medications   aspirin EC 81 MG tablet     Genitourinary   Scrotal edema    Patient is a 74 year old male who presents to clinic today to establish care. After assessment patient is noted with enlarged scrotum. Patient reports that this has been present in the last few weeks. Nothing has been done to alleviate symptoms. Patient is not reporting any pain but slight pressure with urinary retention. Provided education to patient with printed handout Referral to urology and surgical consult completed. Urinalysis obtained and completed which shows copious amount of bacteria and elevated white blood count.          Other   Mixed hyperlipidemia    Relevant Medications   aspirin EC 81 MG tablet   Other Relevant Orders   Lipid Panel   Annual physical exam -  Primary    Patient is a 74 year old male who presents to clinic to establish care and physical exam. Head to toe assessment completed. Provided education to patient for preventative care, health maintenance. Advised patient to complete pneumonia vaccine, Covid vaccine, and colonoscopy.  Referrals completed. Obtain lab work-CBC, CMP, lipids and urinalysis.      Relevant Orders   CBC with Differential   Dysuria   Relevant Orders   Urine Microscopic (Completed)   Urine Culture      Outpatient Encounter Medications as of 01/13/2020  Medication Sig   aspirin EC 81 MG tablet Take 81 mg by mouth daily. Swallow whole.   atorvastatin (LIPITOR) 80 MG tablet Take 1 tablet (80 mg total) by mouth daily.   ezetimibe (ZETIA) 10 MG tablet Take 1 tablet (10 mg total) by mouth daily.   furosemide (LASIX) 40 MG tablet Take 1 tablet (40 mg total) by mouth daily.   metoprolol succinate (TOPROL-XL) 50 MG 24 hr tablet Take 1 tablet (50 mg total) by mouth daily.   nitroGLYCERIN (NITROSTAT) 0.4 MG SL tablet Place 1 tablet (0.4 mg total) under the tongue every 5 (five) minutes x 3 doses as needed for chest pain.   sacubitril-valsartan (ENTRESTO) 97-103 MG Take 1 tablet by mouth 2 (two) times daily.   spironolactone (ALDACTONE) 25 MG tablet Take 12.5 mg by mouth daily.   ticagrelor (BRILINTA) 90 MG TABS tablet Take 1 tablet (90 mg total) by mouth 2 (two) times daily.   No facility-administered encounter medications on file as of 01/13/2020.    Follow-up: Return in about 3 months (around 04/13/2020).   Daryll Drown, NP

## 2020-01-14 LAB — LIPID PANEL
Chol/HDL Ratio: 2.7 ratio (ref 0.0–5.0)
Cholesterol, Total: 103 mg/dL (ref 100–199)
HDL: 38 mg/dL — ABNORMAL LOW (ref >39)
LDL Chol Calc (NIH): 54 mg/dL (ref 0–99)
Triglycerides: 45 mg/dL (ref 0–149)
VLDL Cholesterol Cal: 11 mg/dL (ref 5–40)

## 2020-01-14 LAB — CBC WITH DIFFERENTIAL/PLATELET
Basophils Absolute: 0 10*3/uL (ref 0.0–0.2)
Basos: 0 %
EOS (ABSOLUTE): 0.1 10*3/uL (ref 0.0–0.4)
Eos: 1 %
Hematocrit: 42.7 % (ref 37.5–51.0)
Hemoglobin: 14.4 g/dL (ref 13.0–17.7)
Immature Grans (Abs): 0 10*3/uL (ref 0.0–0.1)
Immature Granulocytes: 0 %
Lymphocytes Absolute: 1.1 10*3/uL (ref 0.7–3.1)
Lymphs: 18 %
MCH: 31 pg (ref 26.6–33.0)
MCHC: 33.7 g/dL (ref 31.5–35.7)
MCV: 92 fL (ref 79–97)
Monocytes Absolute: 0.8 10*3/uL (ref 0.1–0.9)
Monocytes: 13 %
Neutrophils Absolute: 4.1 10*3/uL (ref 1.4–7.0)
Neutrophils: 68 %
Platelets: 273 10*3/uL (ref 150–450)
RBC: 4.64 x10E6/uL (ref 4.14–5.80)
RDW: 12.4 % (ref 11.6–15.4)
WBC: 6.1 10*3/uL (ref 3.4–10.8)

## 2020-01-17 ENCOUNTER — Other Ambulatory Visit: Payer: Self-pay | Admitting: Nurse Practitioner

## 2020-01-17 LAB — URINE CULTURE

## 2020-01-17 MED ORDER — SULFAMETHOXAZOLE-TRIMETHOPRIM 800-160 MG PO TABS: 1.0000 | ORAL_TABLET | Freq: Two times a day (BID) | ORAL | 0 refills | Status: DC

## 2020-01-24 ENCOUNTER — Encounter: Payer: Self-pay | Admitting: General Surgery

## 2020-01-24 ENCOUNTER — Ambulatory Visit (INDEPENDENT_AMBULATORY_CARE_PROVIDER_SITE_OTHER): Payer: Medicare HMO | Admitting: General Surgery

## 2020-01-24 ENCOUNTER — Other Ambulatory Visit: Payer: Self-pay

## 2020-01-24 VITALS — BP 112/79 | HR 89 | Temp 97.9°F | Resp 12 | Ht 72.0 in | Wt 177.0 lb

## 2020-01-24 DIAGNOSIS — K4090 Unilateral inguinal hernia, without obstruction or gangrene, not specified as recurrent: Secondary | ICD-10-CM | POA: Diagnosis not present

## 2020-01-24 NOTE — Patient Instructions (Signed)

## 2020-01-24 NOTE — Progress Notes (Signed)
Patrick Weber; [id number]; Mar 16, 1946   HPI Patient is a 74 year old white male who was referred to my care by Patrick Weber for evaluation and treatment of a left inguinal hernia.  Patient states he has had it for the past 6 months but is increasing in size and is causing him discomfort.  It does reduce on its own when lying back, but recently it seems to have more difficulty reducing.  He denies any nausea or vomiting.  It is made worse with straining.  He does have a significant history of coronary artery disease and congestive heart failure.  His last echo showed an ejection fraction of around 45%.  He is seeing his cardiologist on February 15, 2020. Past Medical History:  Diagnosis Date  . Acute ST elevation myocardial infarction (STEMI) of inferior wall (HCC) 09/18/2016  . 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   . Hyperlipemia     Past Surgical History:  Procedure Laterality Date  . CORONARY BALLOON ANGIOPLASTY N/A 07/22/2019   Procedure: CORONARY BALLOON ANGIOPLASTY;  Surgeon: Patrick Ouch, MD;  Location: [facility];  Service: Cardiovascular;  Laterality: N/A;  . CORONARY STENT INTERVENTION N/A 07/22/2019   Procedure: CORONARY STENT INTERVENTION;  Surgeon: Patrick Ouch, MD;  Location: [facility];  Service: Cardiovascular;  Laterality: N/A;  . LEFT HEART CATH AND CORONARY ANGIOGRAPHY N/A 09/18/2016   Procedure: Left Heart Cath and Coronary Angiography;  Surgeon: Patrick Kendall, MD;  Location: [facility];  Service: Cardiovascular;  Laterality: N/A;  . LEFT HEART CATH AND CORONARY ANGIOGRAPHY N/A 07/22/2019   Procedure: LEFT HEART CATH AND CORONARY ANGIOGRAPHY;  Surgeon: Patrick Ouch, MD;  Location: [facility];  Service: Cardiovascular;  Laterality: N/A;    Family History  Problem Relation Age of  Onset  . Heart disease Mother        heart valve problem at age 76  . Emphysema Father   . Other Son        enlarged aorta     Current Outpatient Medications on File Prior to Visit  Medication Sig Dispense Refill  . acetaminophen (TYLENOL) 500 MG tablet Take 1 tablet (500 mg total) by mouth every 6 (six) hours as needed. 30 tablet 0  . aspirin EC 81 MG tablet Take 81 mg by mouth daily. Swallow whole.    [name] atorvastatin (LIPITOR) 80 MG tablet Take 1 tablet (80 mg total) by mouth daily. 90 tablet 0  . ezetimibe (ZETIA) 10 MG tablet Take 1 tablet (10 mg total) by mouth daily. 90 tablet 0  . furosemide (LASIX) 40 MG tablet Take 1 tablet (40 mg total) by mouth daily. 90 tablet 0  . metoprolol succinate (TOPROL-XL) 50 MG 24 hr tablet Take 1 tablet (50 mg total) by mouth daily. 90 tablet 1  . nitroGLYCERIN (NITROSTAT) 0.4 MG SL tablet Place 1 tablet (0.4 mg total) under the tongue every 5 (five) minutes x 3 doses as needed for chest pain. 25 tablet 2  . sacubitril-valsartan (ENTRESTO) 97-103 MG Take 1 tablet by mouth 2 (two) times daily. 60 tablet 6  . spironolactone (ALDACTONE) 25 MG tablet Take 12.5 mg by mouth daily.    . ticagrelor (BRILINTA) 90 MG TABS tablet Take 1 tablet (90 mg total) by mouth 2 (two) times daily. 180 tablet 1   No current  facility-administered medications on file prior to visit.    Allergies  Allergen Reactions  . Lidocaine Other (See Comments)    Makes him sleepy.    Social History   Substance and Sexual Activity  Alcohol Use Not Currently    Social History   Tobacco Use  Smoking Status Never Smoker  Smokeless Tobacco Never Used    Review of Systems  Constitutional: Negative.   HENT: Negative.   Eyes: Negative.   Respiratory: Negative.   Cardiovascular: Negative.   Genitourinary: Positive for frequency.  Musculoskeletal: Positive for neck pain.  Skin: Negative.   Neurological: Negative.   Endo/Heme/Allergies: Negative.   Psychiatric/Behavioral:  Negative.     Objective   Vitals:   01/24/20 1049  BP: 112/79  Pulse: 89  Resp: 12  Temp: 97.9 F (36.6 C)  SpO2: 98%    Physical Exam Vitals reviewed.  Constitutional:      Appearance: Normal appearance. He is normal weight. He is not ill-appearing.  HENT:     Head: Normocephalic and atraumatic.  Cardiovascular:     Rate and Rhythm: Normal rate and regular rhythm.     Heart sounds: Normal heart sounds. No murmur heard.  No friction rub. No gallop.   Pulmonary:     Effort: Pulmonary effort is normal. No respiratory distress.     Breath sounds: Normal breath sounds. No stridor. No wheezing, rhonchi or rales.  Abdominal:     General: Bowel sounds are normal. There is no distension.     Palpations: Abdomen is soft. There is no mass.     Tenderness: There is no abdominal tenderness. There is no guarding or rebound.     Hernia: A hernia is present.     Comments: Large but reducible left inguinal hernia.  Genitourinary:    Comments: Genitourinary examination is within normal limits. Skin:    General: Skin is warm and dry.  Neurological:     Mental Status: He is alert and oriented to person, place, and time.    Cardiology notes reviewed Assessment  Symptomatic left inguinal hernia Coronary artery disease, congestive heart failure Plan   As patient is becoming more symptomatic, I would like to proceed with a left inguinal hernia.  This is pending cardiology preoperative clearance.  He will be seeing his cardiologist on February 15, 2020.  Once he is cleared, he will call my office to schedule surgery.  The risks and benefits of the procedure including bleeding, infection, mesh use, and the possibility of recurrence of the hernia were fully explained to the patient, who gave informed consent.

## 2020-02-03 ENCOUNTER — Ambulatory Visit (INDEPENDENT_AMBULATORY_CARE_PROVIDER_SITE_OTHER): Payer: Medicare HMO | Admitting: Internal Medicine

## 2020-02-03 ENCOUNTER — Other Ambulatory Visit: Payer: Self-pay

## 2020-02-03 ENCOUNTER — Encounter: Payer: Self-pay | Admitting: Internal Medicine

## 2020-02-03 VITALS — BP 130/80 | HR 65 | Temp 97.9°F | Ht 73.0 in | Wt 181.6 lb

## 2020-02-03 DIAGNOSIS — E785 Hyperlipidemia, unspecified: Secondary | ICD-10-CM

## 2020-02-03 DIAGNOSIS — Z79899 Other long term (current) drug therapy: Secondary | ICD-10-CM | POA: Diagnosis not present

## 2020-02-03 DIAGNOSIS — I255 Ischemic cardiomyopathy: Secondary | ICD-10-CM | POA: Diagnosis not present

## 2020-02-03 DIAGNOSIS — I10 Essential (primary) hypertension: Secondary | ICD-10-CM

## 2020-02-03 DIAGNOSIS — I2510 Atherosclerotic heart disease of native coronary artery without angina pectoris: Secondary | ICD-10-CM

## 2020-02-03 DIAGNOSIS — I5042 Chronic combined systolic (congestive) and diastolic (congestive) heart failure: Secondary | ICD-10-CM | POA: Diagnosis not present

## 2020-02-03 DIAGNOSIS — I712 Thoracic aortic aneurysm, without rupture, unspecified: Secondary | ICD-10-CM

## 2020-02-03 DIAGNOSIS — Z01818 Encounter for other preprocedural examination: Secondary | ICD-10-CM

## 2020-02-03 NOTE — Progress Notes (Signed)
Cardiology Office Note:    Date:  02/03/2020   ID:  Patrick, Weber November 30, 1945, MRN [id number]  PCP:  [name], FNP  Cardiologist:  [name], MD  Electrophysiologist:  None   Referring MD: [name], FNP   Chief Complaint/Reason for Referral: Preoperative evaluation  History of Present Illness:    Patrick Weber is a 74 y.o. male with a history of prior inferior STEMI in 2018 with RCA stent x 2, recent hospitalization for chest pain and acute on chronic systolic HF with drop in EF, subsequently found to have high grade proximal and mid LAD disease now s/p PCI, with patent RCA stent from prior. He was followed closely after discharge by my colleague [name], and HF therapy has been optimized. History also includes thoracic aortic aneursym, HTN and HLD.  Follow up echocardiogram showed improvement in EF though still reduced, 40-45%. Dilated ascending aorta.   Current medical therapy includes Entresto 97-103 mg BID, metoprolol succinate 50 mg daily, lasix 40 mg daily, spironolactone 12.5 mg daily. For CAD he is also on ASA 81 mg daily, Brilinta 90 mg BID (1 year of DAPT will be completed on 07/21/20), atorvastatin 80 mg daily, SL nitro.   Presents for preoperative evaluation prior to left inguinal hernia repair. He is becoming increasingly symptomatic from hernia and surgical cannot likely wait for completion of DAPT. I have communicated with Dr. Lovell Sheehan regarding [name] care. I had discussed these recommendations and thoughts with the patient in detail.   Able to complete >4 METS without chest pain or SOB.  No episodes of decompensated heart failure in the last 6 months.  No known anesthesia complications. No bleeding events on DAPT thusfar.   Past Medical History:  Diagnosis Date  . Acute ST elevation myocardial infarction (STEMI) of inferior wall (HCC) 09/18/2016  . Coronary artery disease involving native coronary artery of native heart with unstable  angina pectoris (HCC) 09/18/2016   100% thrombotic RCA occlusion - DES x 2 with distal embolization.   . Hyperlipemia     Past Surgical History:  Procedure Laterality Date  . CORONARY BALLOON ANGIOPLASTY N/A 07/22/2019   Procedure: CORONARY BALLOON ANGIOPLASTY;  Surgeon: [name], MD;  [facility]: [facility];  Service: Cardiovascular;  Laterality: N/A;  . CORONARY STENT INTERVENTION N/A 07/22/2019   Procedure: CORONARY STENT INTERVENTION;  Surgeon: [name], MD;  [facility];  Service: Cardiovascular;  Laterality: N/A;  . LEFT HEART CATH AND CORONARY ANGIOGRAPHY N/A 09/18/2016   Procedure: Left Heart Cath and Coronary Angiography;  Surgeon: [name], MD;  [facility]: [facility];  Service: Cardiovascular;  Laterality: N/A;  . LEFT HEART CATH AND CORONARY ANGIOGRAPHY N/A 07/22/2019   Procedure: LEFT HEART CATH AND CORONARY ANGIOGRAPHY;  Surgeon: [name], MD;  [facility];  Service: Cardiovascular;  Laterality: N/A;    Current Medications: Current Meds  Medication Sig  . acetaminophen (TYLENOL) 500 MG tablet Take 1 tablet (500 mg total) by mouth every 6 (six) hours as needed.  [name] aspirin EC 81 MG tablet Take 81 mg by mouth daily. Swallow whole.  . furosemide (LASIX) 40 MG tablet Take 1 tablet (40 mg total) by mouth daily.  . nitroGLYCERIN (NITROSTAT) 0.4 MG SL tablet Place 1 tablet (0.4 mg total) under the tongue every 5 (five) minutes x 3 doses as needed for chest pain.  . sacubitril-valsartan (ENTRESTO) 97-103 MG Take 1  tablet by mouth 2 (two) times daily.  [name] spironolactone (ALDACTONE) 25 MG tablet Take 12.5 mg by mouth daily.  . ticagrelor (BRILINTA) 90 MG TABS tablet Take 1 tablet (90 mg total) by mouth 2 (two) times daily.     Allergies:   Lidocaine   Social History   Tobacco Use  . Smoking status: Never Smoker  . Smokeless tobacco: Never Used  Vaping Use  . Vaping Use: Never used  Substance Use Topics   . Alcohol use: Not Currently  . Drug use: No     Family History: The patient's family history includes Emphysema in his father; Heart disease in his mother; Other in his son.  ROS:   Please see the history of present illness.    All other systems reviewed and are negative.  EKGs/Labs/Other Studies Reviewed:    The following studies were reviewed today:  EKG:  NSR, anterior infarct  Recent Labs: 07/21/2019: B Natriuretic Peptide 645.2; Magnesium 2.0 10/21/2019: BUN 19; Creatinine, Ser 1.00; Potassium 4.2; Sodium 142 01/13/2020: Hemoglobin 14.4; Platelets 273  Recent Lipid Panel    Component Value Date/Time   CHOL 103 01/13/2020 1128   TRIG 45 01/13/2020 1128   HDL 38 (L) 01/13/2020 1128   CHOLHDL 2.7 01/13/2020 1128   CHOLHDL 4.4 09/19/2016 0041   VLDL 14 09/19/2016 0041   LDLCALC 54 01/13/2020 1128    Physical Exam:    VS:  BP 130/80   Pulse 65   Temp 97.9 F (36.6 C)   Ht 6\' 1"  (1.854 m)   Wt 181 lb 9.6 oz (82.4 kg)   SpO2 97%   BMI 23.96 kg/m     Wt Readings from Last 5 Encounters:  02/03/20 181 lb 9.6 oz (82.4 kg)  01/24/20 177 lb (80.3 kg)  01/13/20 175 lb (79.4 kg)  11/08/19 188 lb 9.6 oz (85.5 kg)  10/05/19 181 lb (82.1 kg)    Constitutional: No acute distress Eyes: sclera non-icteric, normal conjunctiva and lids ENMT: normal dentition, moist mucous membranes Cardiovascular: regular rhythm, normal rate, no murmurs. S1 and S2 normal. Radial pulses normal bilaterally. No jugular venous distention.  Respiratory: clear to auscultation bilaterally GI : normal bowel sounds, soft and nontender. No distention.   MSK: extremities warm, well perfused. No edema.  NEURO: grossly nonfocal exam, moves all extremities. PSYCH: alert and oriented x 3, normal mood and affect.   ASSESSMENT:    1. Pre-op evaluation   2. Chronic combined systolic and diastolic congestive heart failure (HCC)   3. Ischemic cardiomyopathy   4. Coronary artery disease involving native  coronary artery of native heart without angina pectoris   5. Medication management   6. Essential hypertension   7. Hyperlipidemia LDL goal <70   8. Thoracic aortic aneurysm without rupture (HCC)    PLAN:    Pre-op evaluation - Plan: EKG 12-Lead   Risk stratification discussed in detail with the patient. The patient is intermediate risk for intermediate risk procedure.  No further cardiovascular testing is required prior to the procedure.  If this level of risk is acceptable to the patient and surgical team, the patient should be considered optimized from a cardiovascular standpoint.  The patient understands risk level for MACE and is willing to proceed.  Coronary artery disease status post PCI in March 2021 -He will likely require lifelong dual antiplatelet therapy.  Most recent stenting was for systolic heart failure, not in the setting of ACS.  It has been greater than 6 months since  his PCI.  It therefore seems reasonable to hold Brilinta for 5 days prior to left inguinal hernia repair, and resume Brilinta when safe from a postoperative standpoint to be determined by surgical team.  Continue aspirin throughout the perioperative period without interruption.  I have discussed with the patient the risk of in-stent thrombosis with regard to this plan.  We have reviewed that the risk is low but not 0.  I have reviewed this multiple times during our visit today, and the patient understands and is willing to proceed with this plan.  Chronic combined systolic and diastolic heart failure-continue Entresto, spironolactone, metoprolol succinate, furosemide.  Total time of encounter: 30 minutes total time of encounter, including 20 minutes spent in face-to-face patient care on the date of this encounter. This time includes coordination of care and counseling regarding above mentioned problem list. Remainder of non-face-to-face time involved reviewing chart documents/testing relevant to the patient  encounter and documentation in the medical record. I have independently reviewed documentation from referring provider.   Weston Brass, MD [place]  [facility]    Medication Adjustments/Labs and Tests Ordered: Current medicines are reviewed at length with the patient today.  Concerns regarding medicines are outlined above.   Orders Placed This Encounter  Procedures  . EKG 12-Lead    No orders of the defined types were placed in this encounter.   Patient Instructions  Medication Instructions:  Your Physician recommend you continue on your current medication as directed.    *If you need a refill on your cardiac medications before your next appointment, please call your pharmacy*   Lab Work: None ordered   Testing/Procedures: None ordered    Follow-Up: At [facility], you and your health needs are our priority.  As part of our continuing mission to provide you with exceptional heart care, we have created designated Provider Care Teams.  These Care Teams include your primary Cardiologist (physician) and Advanced Practice Providers (APPs -  Physician Assistants and Nurse Practitioners) who all work together to provide you with the care you need, when you need it.  We recommend signing up for the patient portal called "MyChart".  Sign up information is provided on this After Visit Summary.  MyChart is used to connect with patients for Virtual Visits (Telemedicine).  Patients are able to view lab/test results, encounter notes, upcoming appointments, etc.  Non-urgent messages can be sent to your provider as well.   To learn more about what you can do with MyChart, go to [email or web address].    Your next appointment:   4 month(s)  The format for your next appointment:   In Person  Provider:   Weston Brass, MD   Other Instructions Per discussion, please hold Brilinta 5 days prior to surgery and resume when determined its safe by surgeon. Continue with  Asprin.

## 2020-02-03 NOTE — Patient Instructions (Addendum)
Medication Instructions:  Your Physician recommend you continue on your current medication as directed.    *If you need a refill on your cardiac medications before your next appointment, please call your pharmacy*   Lab Work: None ordered   Testing/Procedures: None ordered    Follow-Up: At [facility], you and your health needs are our priority.  As part of our continuing mission to provide you with exceptional heart care, we have created designated Provider Care Teams.  These Care Teams include your primary Cardiologist (physician) and Advanced Practice Providers (APPs -  Physician Assistants and Nurse Practitioners) who all work together to provide you with the care you need, when you need it.  We recommend signing up for the patient portal called "MyChart".  Sign up information is provided on this After Visit Summary.  MyChart is used to connect with patients for Virtual Visits (Telemedicine).  Patients are able to view lab/test results, encounter notes, upcoming appointments, etc.  Non-urgent messages can be sent to your provider as well.   To learn more about what you can do with MyChart, go to [email or web address].    Your next appointment:   4 month(s)  The format for your next appointment:   In Person  Provider:   Weston Brass, MD   Other Instructions Per discussion, please hold Brilinta 5 days prior to surgery and resume when determined its safe by surgeon. Continue with Asprin.

## 2020-02-09 ENCOUNTER — Ambulatory Visit: Payer: Medicare HMO | Admitting: General Surgery

## 2020-02-09 ENCOUNTER — Other Ambulatory Visit: Payer: Self-pay

## 2020-02-09 ENCOUNTER — Encounter: Payer: Self-pay | Admitting: General Surgery

## 2020-02-09 VITALS — BP 127/81 | HR 67 | Temp 98.0°F | Resp 14 | Ht 72.0 in | Wt 180.0 lb

## 2020-02-09 DIAGNOSIS — K4090 Unilateral inguinal hernia, without obstruction or gangrene, not specified as recurrent: Secondary | ICD-10-CM

## 2020-02-09 NOTE — Progress Notes (Signed)
Subjective:     Patrick Weber  Patient is here to schedule his left inguinal herniorrhaphy with mesh.  He has been seen by cardiology and due to his increasing discomfort, they are okay with proceeding with surgery. Objective:    BP 127/81   Pulse 67   Temp 98 F (36.7 C) (Oral)   Resp 14   Ht 6' (1.829 m)   Wt 180 lb (81.6 kg)   SpO2 99%   BMI 24.41 kg/m   General:  alert, cooperative and no distress       Assessment:    Left inguinal hernia On Brilinta for drug-eluting stent placement   Plan:   Patient is scheduled for left inguinal herniorrhaphy with mesh on 02/22/2020.  The risks and benefits of the procedure including bleeding, infection, mesh use, the possibility of recurrence of the hernia were fully explained to the patient, who gave informed consent.  We will stop his Brilinta 5 days before the procedure.

## 2020-02-09 NOTE — Patient Instructions (Addendum)
Stop your Brilinta on 10/15, five days before your surgery    Inguinal Hernia, Adult An inguinal hernia develops when fat or the intestines push through a weak spot in a muscle where your leg meets your lower abdomen (groin). This creates a bulge. This kind of hernia could also be:  In your scrotum, if you are male.  In folds of skin around your vagina, if you are male. There are three types of inguinal hernias:  Hernias that can be pushed back into the abdomen (are reducible). This type rarely causes pain.  Hernias that are not reducible (are incarcerated).  Hernias that are not reducible and lose their blood supply (are strangulated). This type of hernia requires emergency surgery. What are the causes? This condition is caused by having a weak spot in the muscles or tissues in the groin. This weak spot develops over time. The hernia may poke through the weak spot when you suddenly strain your lower abdominal muscles, such as when you:  Lift a heavy object.  Strain to have a bowel movement. Constipation can lead to straining.  Cough. What increases the risk? This condition is more likely to develop in:  Men.  Pregnant women.  People who: ? Are overweight. ? Work in jobs that require long periods of standing or heavy lifting. ? Have had an inguinal hernia before. ? Smoke or have lung disease. These factors can lead to long-lasting (chronic) coughing. What are the signs or symptoms? Symptoms may depend on the size of the hernia. Often, a small inguinal hernia has no symptoms. Symptoms of a larger hernia may include:  A lump in the groin area. This is easier to see when standing. It might not be visible when lying down.  Pain or burning in the groin. This may get worse when lifting, straining, or coughing.  A dull ache or a feeling of pressure in the groin.  In men, an unusual lump in the scrotum. Symptoms of a strangulated inguinal hernia may include:  A bulge in  your groin that is very painful and tender to the touch.  A bulge that turns red or purple.  Fever, nausea, and vomiting.  Inability to have a bowel movement or to pass gas. How is this diagnosed? This condition is diagnosed based on your symptoms, your medical history, and a physical exam. Your health care provider may feel your groin area and ask you to cough. How is this treated? Treatment depends on the size of your hernia and whether you have symptoms. If you do not have symptoms, your health care provider may have you watch your hernia carefully and have you come in for follow-up visits. If your hernia is large or if you have symptoms, you may need surgery to repair the hernia. Follow these instructions at home: Lifestyle  Avoid lifting heavy objects.  Avoid standing for long periods of time.  Do not use any products that contain nicotine or tobacco, such as cigarettes and e-cigarettes. If you need help quitting, ask your health care provider.  Maintain a healthy weight. Preventing constipation  Take actions to prevent constipation. Constipation leads to straining with bowel movements, which can make a hernia worse or cause a hernia repair to break down. Your health care provider may recommend that you: ? Drink enough fluid to keep your urine pale yellow. ? Eat foods that are high in fiber, such as fresh fruits and vegetables, whole grains, and beans. ? Limit foods that are high in fat  and processed sugars, such as fried or sweet foods. ? Take an over-the-counter or prescription medicine for constipation. General instructions  You may try to push the hernia back in place by very gently pressing on it while lying down. Do not try to force the bulge back in if it will not push in easily.  Watch your hernia for any changes in shape, size, or color. Get help right away if you notice any changes.  Take over-the-counter and prescription medicines only as told by your health care  provider.  Keep all follow-up visits as told by your health care provider. This is important. Contact a health care provider if:  You have a fever.  You develop new symptoms.  Your symptoms get worse. Get help right away if:  You have pain in your groin that suddenly gets worse.  You have a bulge in your groin that: ? Suddenly gets bigger and does not get smaller. ? Becomes red or purple or painful to the touch.  You are a man and you have a sudden pain in your scrotum, or the size of your scrotum suddenly changes.  You cannot push the hernia back in place by very gently pressing on it when you are lying down. Do not try to force the bulge back in if it will not push in easily.  You have nausea or vomiting that does not go away.  You have a fast heartbeat.  You cannot have a bowel movement or pass gas. These symptoms may represent a serious problem that is an emergency. Do not wait to see if the symptoms will go away. Get medical help right away. Call your local emergency services (911 in the [place] Summary  An inguinal hernia develops when fat or the intestines push through a weak spot in a muscle where your leg meets your lower abdomen (groin).  This condition is caused by having a weak spot in muscles or tissue in your groin.  Symptoms may depend on the size of the hernia, and they may include pain or swelling in your groin. A small inguinal hernia often has no symptoms.  Treatment may not be needed if you do not have symptoms. If you have symptoms or a large hernia, you may need surgery to repair the hernia.  Avoid lifting heavy objects. Also avoid standing for long amounts of time. This information is not intended to replace advice given to you by your health care provider. Make sure you discuss any questions you have with your health care provider. Document Revised: 05/23/2017 Document Reviewed: 01/21/2017 Elsevier Patient Education  2020 [employer].

## 2020-02-10 NOTE — H&P (Signed)
Patrick Weber; [id number]; Mar 16, 1946   HPI Patient is a 74 year old white male who was referred to my care by Deliah Boston for evaluation and treatment of a left inguinal hernia.  Patient states he has had it for the past 6 months but is increasing in size and is causing him discomfort.  It does reduce on its own when lying back, but recently it seems to have more difficulty reducing.  He denies any nausea or vomiting.  It is made worse with straining.  He does have a significant history of coronary artery disease and congestive heart failure.  His last echo showed an ejection fraction of around 45%.  He is seeing his cardiologist on February 15, 2020. Past Medical History:  Diagnosis Date  . Acute ST elevation myocardial infarction (STEMI) of inferior wall (HCC) 09/18/2016  . 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   . Hyperlipemia     Past Surgical History:  Procedure Laterality Date  . CORONARY BALLOON ANGIOPLASTY N/A 07/22/2019   Procedure: CORONARY BALLOON ANGIOPLASTY;  Surgeon: [name], MD;  Location: [facility];  Service: Cardiovascular;  Laterality: N/A;  . CORONARY STENT INTERVENTION N/A 07/22/2019   Procedure: CORONARY STENT INTERVENTION;  Surgeon: [name], MD;  Location: [facility];  Service: Cardiovascular;  Laterality: N/A;  . LEFT HEART CATH AND CORONARY ANGIOGRAPHY N/A 09/18/2016   Procedure: Left Heart Cath and Coronary Angiography;  Surgeon: [name], MD;  Location: [facility];  Service: Cardiovascular;  Laterality: N/A;  . LEFT HEART CATH AND CORONARY ANGIOGRAPHY N/A 07/22/2019   Procedure: LEFT HEART CATH AND CORONARY ANGIOGRAPHY;  Surgeon: [name], MD;  Location: [facility];  Service: Cardiovascular;  Laterality: N/A;    Family History  Problem Relation Age of  Onset  . Heart disease Mother        heart valve problem at age 76  . Emphysema Father   . Other Son        enlarged aorta     Current Outpatient Medications on File Prior to Visit  Medication Sig Dispense Refill  . acetaminophen (TYLENOL) 500 MG tablet Take 1 tablet (500 mg total) by mouth every 6 (six) hours as needed. 30 tablet 0  . aspirin EC 81 MG tablet Take 81 mg by mouth daily. Swallow whole.    [name] atorvastatin (LIPITOR) 80 MG tablet Take 1 tablet (80 mg total) by mouth daily. 90 tablet 0  . ezetimibe (ZETIA) 10 MG tablet Take 1 tablet (10 mg total) by mouth daily. 90 tablet 0  . furosemide (LASIX) 40 MG tablet Take 1 tablet (40 mg total) by mouth daily. 90 tablet 0  . metoprolol succinate (TOPROL-XL) 50 MG 24 hr tablet Take 1 tablet (50 mg total) by mouth daily. 90 tablet 1  . nitroGLYCERIN (NITROSTAT) 0.4 MG SL tablet Place 1 tablet (0.4 mg total) under the tongue every 5 (five) minutes x 3 doses as needed for chest pain. 25 tablet 2  . sacubitril-valsartan (ENTRESTO) 97-103 MG Take 1 tablet by mouth 2 (two) times daily. 60 tablet 6  . spironolactone (ALDACTONE) 25 MG tablet Take 12.5 mg by mouth daily.    . ticagrelor (BRILINTA) 90 MG TABS tablet Take 1 tablet (90 mg total) by mouth 2 (two) times daily. 180 tablet 1   No current  facility-administered medications on file prior to visit.    Allergies  Allergen Reactions  . Lidocaine Other (See Comments)    Makes him sleepy.    Social History   Substance and Sexual Activity  Alcohol Use Not Currently    Social History   Tobacco Use  Smoking Status Never Smoker  Smokeless Tobacco Never Used    Review of Systems  Constitutional: Negative.   HENT: Negative.   Eyes: Negative.   Respiratory: Negative.   Cardiovascular: Negative.   Genitourinary: Positive for frequency.  Musculoskeletal: Positive for neck pain.  Skin: Negative.   Neurological: Negative.   Endo/Heme/Allergies: Negative.   Psychiatric/Behavioral:  Negative.     Objective   Vitals:   01/24/20 1049  BP: 112/79  Pulse: 89  Resp: 12  Temp: 97.9 F (36.6 C)  SpO2: 98%    Physical Exam Vitals reviewed.  Constitutional:      Appearance: Normal appearance. He is normal weight. He is not ill-appearing.  HENT:     Head: Normocephalic and atraumatic.  Cardiovascular:     Rate and Rhythm: Normal rate and regular rhythm.     Heart sounds: Normal heart sounds. No murmur heard.  No friction rub. No gallop.   Pulmonary:     Effort: Pulmonary effort is normal. No respiratory distress.     Breath sounds: Normal breath sounds. No stridor. No wheezing, rhonchi or rales.  Abdominal:     General: Bowel sounds are normal. There is no distension.     Palpations: Abdomen is soft. There is no mass.     Tenderness: There is no abdominal tenderness. There is no guarding or rebound.     Hernia: A hernia is present.     Comments: Large but reducible left inguinal hernia.  Genitourinary:    Comments: Genitourinary examination is within normal limits. Skin:    General: Skin is warm and dry.  Neurological:     Mental Status: He is alert and oriented to person, place, and time.    Cardiology notes reviewed Assessment  Symptomatic left inguinal hernia Coronary artery disease, congestive heart failure Plan   As patient is becoming more symptomatic, I would like to proceed with a left inguinal hernia.  This is pending cardiology preoperative clearance.  He will be seeing his cardiologist on February 15, 2020.  Once he is cleared, he will call my office to schedule surgery.  The risks and benefits of the procedure including bleeding, infection, mesh use, and the possibility of recurrence of the hernia were fully explained to the patient, who gave informed consent. Addendum: Patient has been cleared by cardiology.  Patient will stop his Brilinta 5 days before his surgery.

## 2020-02-15 ENCOUNTER — Ambulatory Visit: Payer: Medicare HMO | Admitting: Internal Medicine

## 2020-02-17 NOTE — Patient Instructions (Signed)
Patrick Weber  02/17/2020     @PREFPERIOPPHARMACY @   Your procedure is scheduled on  02/22/2020  Report to 02/24/2020 at  [facility]  A.M.  Call this number if you have problems the morning of surgery:  [phone]   Remember:  Do not eat or drink after midnight.                        Take these medicines the morning of surgery with A SIP OF WATER metoprolol, entresto.    Do not wear jewelry, make-up or nail polish.  Do not wear lotions, powders, or perfumes. Please wear deodorant and brush your teeth.  Do not shave 48 hours prior to surgery.  Men may shave face and neck.  Do not bring valuables to the hospital.  [facility] is not responsible for any belongings or valuables.  Contacts, dentures or bridgework may not be worn into surgery.  Leave your suitcase in the car.  After surgery it may be brought to your room.  For patients admitted to the hospital, discharge time will be determined by your treatment team.  Patients discharged the day of surgery will not be allowed to drive home.   Name and phone number of your driver:   Family Special instructions:   DO NOT smoke the morning of your procedure.  Please read over the following fact sheets that you were given. Anesthesia Post-op Instructions and Care and Recovery After Surgery       Open Hernia Repair, Adult, Care After These instructions give you information about caring for yourself after your procedure. Your doctor may also give you more specific instructions. If you have problems or questions, contact your doctor. Follow these instructions at home: Surgical cut (incision) care   Follow instructions from your doctor about how to take care of your surgical cut area. Make sure you: ? Wash your hands with soap and water before you change your bandage (dressing). If you cannot use soap and water, use hand sanitizer. ? Change your bandage as told by your doctor. ? Leave stitches (sutures), skin glue, or  skin tape (adhesive) strips in place. They may need to stay in place for 2 weeks or longer. If tape strips get loose and curl up, you may trim the loose edges. Do not remove tape strips completely unless your doctor says it is okay.  Check your surgical cut every day for signs of infection. Check for: ? More redness, swelling, or pain. ? More fluid or blood. ? Warmth. ? Pus or a bad smell. Activity  Do not drive or use heavy machinery while taking prescription pain medicine. Do not drive until your doctor says it is okay.  Until your doctor says it is okay: ? Do not lift anything that is heavier than 10 lb (4.5 kg). ? Do not play contact sports.  Return to your normal activities as told by your doctor. Ask your doctor what activities are safe. General instructions  To prevent or treat having a hard time pooping (constipation) while you are taking prescription pain medicine, your doctor may recommend that you: ? Drink enough fluid to keep your pee (urine) clear or pale yellow. ? Take over-the-counter or prescription medicines. ? Eat foods that are high in fiber, such as fresh fruits and vegetables, whole grains, and beans. ? Limit foods that are high in fat and processed sugars, such as fried and sweet  foods.  Take over-the-counter and prescription medicines only as told by your doctor.  Do not take baths, swim, or use a hot tub until your doctor says it is okay.  Keep all follow-up visits as told by your doctor. This is important. Contact a doctor if:  You develop a rash.  You have more redness, swelling, or pain around your surgical cut.  You have more fluid or blood coming from your surgical cut.  Your surgical cut feels warm to the touch.  You have pus or a bad smell coming from your surgical cut.  You have a fever or chills.  You have blood in your poop (stool).  You have not pooped in 2-3 days.  Medicine does not help your pain. Get help right away if:  You have  chest pain or you are short of breath.  You feel light-headed.  You feel weak and dizzy (feel faint).  You have very bad pain.  You throw up (vomit) and your pain is worse. This information is not intended to replace advice given to you by your health care provider. Make sure you discuss any questions you have with your health care provider. Document Revised: 08/13/2018 Document Reviewed: 10/03/2015 Elsevier Patient Education  2020 Elsevier Inc.  General Anesthesia, Adult, Care After This sheet gives you information about how to care for yourself after your procedure. Your health care provider may also give you more specific instructions. If you have problems or questions, contact your health care provider. What can I expect after the procedure? After the procedure, the following side effects are common:  Pain or discomfort at the IV site.  Nausea.  Vomiting.  Sore throat.  Trouble concentrating.  Feeling cold or chills.  Weak or tired.  Sleepiness and fatigue.  Soreness and body aches. These side effects can affect parts of the body that were not involved in surgery. Follow these instructions at home:  For at least 24 hours after the procedure:  Have a responsible adult stay with you. It is important to have someone help care for you until you are awake and alert.  Rest as needed.  Do not: ? Participate in activities in which you could fall or become injured. ? Drive. ? Use heavy machinery. ? Drink alcohol. ? Take sleeping pills or medicines that cause drowsiness. ? Make important decisions or sign legal documents. ? Take care of children on your own. Eating and drinking  Follow any instructions from your health care provider about eating or drinking restrictions.  When you feel hungry, start by eating small amounts of foods that are soft and easy to digest (bland), such as toast. Gradually return to your regular diet.  Drink enough fluid to keep your urine  pale yellow.  If you vomit, rehydrate by drinking water, juice, or clear broth. General instructions  If you have sleep apnea, surgery and certain medicines can increase your risk for breathing problems. Follow instructions from your health care provider about wearing your sleep device: ? Anytime you are sleeping, including during daytime naps. ? While taking prescription pain medicines, sleeping medicines, or medicines that make you drowsy.  Return to your normal activities as told by your health care provider. Ask your health care provider what activities are safe for you.  Take over-the-counter and prescription medicines only as told by your health care provider.  If you smoke, do not smoke without supervision.  Keep all follow-up visits as told by your health care provider. This is  important. Contact a health care provider if:  You have nausea or vomiting that does not get better with medicine.  You cannot eat or drink without vomiting.  You have pain that does not get better with medicine.  You are unable to pass urine.  You develop a skin rash.  You have a fever.  You have redness around your IV site that gets worse. Get help right away if:  You have difficulty breathing.  You have chest pain.  You have blood in your urine or stool, or you vomit blood. Summary  After the procedure, it is common to have a sore throat or nausea. It is also common to feel tired.  Have a responsible adult stay with you for the first 24 hours after general anesthesia. It is important to have someone help care for you until you are awake and alert.  When you feel hungry, start by eating small amounts of foods that are soft and easy to digest (bland), such as toast. Gradually return to your regular diet.  Drink enough fluid to keep your urine pale yellow.  Return to your normal activities as told by your health care provider. Ask your health care provider what activities are safe for  you. This information is not intended to replace advice given to you by your health care provider. Make sure you discuss any questions you have with your health care provider. Document Revised: 04/24/2017 Document Reviewed: 12/05/2016 Elsevier Patient Education  [place]. How to Use Chlorhexidine for Bathing Chlorhexidine gluconate (CHG) is a germ-killing (antiseptic) solution that is used to clean the skin. It can get rid of the bacteria that normally live on the skin and can keep them away for about 24 hours. To clean your skin with CHG, you may be given:  A CHG solution to use in the shower or as part of a sponge bath.  A prepackaged cloth that contains CHG. Cleaning your skin with CHG may help lower the risk for infection:  While you are staying in the intensive care unit of the hospital.  If you have a vascular access, such as a central line, to provide short-term or long-term access to your veins.  If you have a catheter to drain urine from your bladder.  If you are on a ventilator. A ventilator is a machine that helps you breathe by moving air in and out of your lungs.  After surgery. What are the risks? Risks of using CHG include:  A skin reaction.  Hearing loss, if CHG gets in your ears.  Eye injury, if CHG gets in your eyes and is not rinsed out.  The CHG product catching fire. Make sure that you avoid smoking and flames after applying CHG to your skin. Do not use CHG:  If you have a chlorhexidine allergy or have previously reacted to chlorhexidine.  On babies younger than 18 months of age. How to use CHG solution  Use CHG only as told by your health care provider, and follow the instructions on the label.  Use the full amount of CHG as directed. Usually, this is one bottle. During a shower Follow these steps when using CHG solution during a shower (unless your health care provider gives you different instructions): 1. Start the shower. 2. Use your  normal soap and shampoo to wash your face and hair. 3. Turn off the shower or move out of the shower stream. 4. Pour the CHG onto a clean washcloth. Do not use  any type of brush or rough-edged sponge. 5. Starting at your neck, lather your body down to your toes. Make sure you follow these instructions: ? If you will be having surgery, pay special attention to the part of your body where you will be having surgery. Scrub this area for at least 1 minute. ? Do not use CHG on your head or face. If the solution gets into your ears or eyes, rinse them well with water. ? Avoid your genital area. ? Avoid any areas of skin that have broken skin, cuts, or scrapes. ? Scrub your back and under your arms. Make sure to wash skin folds. 6. Let the lather sit on your skin for 1-2 minutes or as long as told by your health care provider. 7. Thoroughly rinse your entire body in the shower. Make sure that all body creases and crevices are rinsed well. 8. Dry off with a clean towel. Do not put any substances on your body afterward--such as powder, lotion, or perfume--unless you are told to do so by your health care provider. Only use lotions that are recommended by the manufacturer. 9. Put on clean clothes or pajamas. 10. If it is the night before your surgery, sleep in clean sheets.  During a sponge bath Follow these steps when using CHG solution during a sponge bath (unless your health care provider gives you different instructions): 1. Use your normal soap and shampoo to wash your face and hair. 2. Pour the CHG onto a clean washcloth. 3. Starting at your neck, lather your body down to your toes. Make sure you follow these instructions: ? If you will be having surgery, pay special attention to the part of your body where you will be having surgery. Scrub this area for at least 1 minute. ? Do not use CHG on your head or face. If the solution gets into your ears or eyes, rinse them well with water. ? Avoid your  genital area. ? Avoid any areas of skin that have broken skin, cuts, or scrapes. ? Scrub your back and under your arms. Make sure to wash skin folds. 4. Let the lather sit on your skin for 1-2 minutes or as long as told by your health care provider. 5. Using a different clean, wet washcloth, thoroughly rinse your entire body. Make sure that all body creases and crevices are rinsed well. 6. Dry off with a clean towel. Do not put any substances on your body afterward--such as powder, lotion, or perfume--unless you are told to do so by your health care provider. Only use lotions that are recommended by the manufacturer. 7. Put on clean clothes or pajamas. 8. If it is the night before your surgery, sleep in clean sheets. How to use CHG prepackaged cloths  Only use CHG cloths as told by your health care provider, and follow the instructions on the label.  Use the CHG cloth on clean, dry skin.  Do not use the CHG cloth on your head or face unless your health care provider tells you to.  When washing with the CHG cloth: ? Avoid your genital area. ? Avoid any areas of skin that have broken skin, cuts, or scrapes. Before surgery Follow these steps when using a CHG cloth to clean before surgery (unless your health care provider gives you different instructions): 1. Using the CHG cloth, vigorously scrub the part of your body where you will be having surgery. Scrub using a back-and-forth motion for 3 minutes. The area on  your body should be completely wet with CHG when you are done scrubbing. 2. Do not rinse. Discard the cloth and let the area air-dry. Do not put any substances on the area afterward, such as powder, lotion, or perfume. 3. Put on clean clothes or pajamas. 4. If it is the night before your surgery, sleep in clean sheets.  For general bathing Follow these steps when using CHG cloths for general bathing (unless your health care provider gives you different instructions). 1. Use a  separate CHG cloth for each area of your body. Make sure you wash between any folds of skin and between your fingers and toes. Wash your body in the following order, switching to a new cloth after each step: ? The front of your neck, shoulders, and chest. ? Both of your arms, under your arms, and your hands. ? Your stomach and groin area, avoiding the genitals. ? Your right leg and foot. ? Your left leg and foot. ? The back of your neck, your back, and your buttocks. 2. Do not rinse. Discard the cloth and let the area air-dry. Do not put any substances on your body afterward--such as powder, lotion, or perfume--unless you are told to do so by your health care provider. Only use lotions that are recommended by the manufacturer. 3. Put on clean clothes or pajamas. Contact a health care provider if:  Your skin gets irritated after scrubbing.  You have questions about using your solution or cloth. Get help right away if:  Your eyes become very red or swollen.  Your eyes itch badly.  Your skin itches badly and is red or swollen.  Your hearing changes.  You have trouble seeing.  You have swelling or tingling in your mouth or throat.  You have trouble breathing.  You swallow any chlorhexidine. Summary  Chlorhexidine gluconate (CHG) is a germ-killing (antiseptic) solution that is used to clean the skin. Cleaning your skin with CHG may help to lower your risk for infection.  You may be given CHG to use for bathing. It may be in a bottle or in a prepackaged cloth to use on your skin. Carefully follow your health care provider's instructions and the instructions on the product label.  Do not use CHG if you have a chlorhexidine allergy.  Contact your health care provider if your skin gets irritated after scrubbing. This information is not intended to replace advice given to you by your health care provider. Make sure you discuss any questions you have with your health care  provider. Document Revised: 07/08/2018 Document Reviewed: 03/19/2017 Elsevier Patient Education  [place].

## 2020-02-20 ENCOUNTER — Encounter (HOSPITAL_COMMUNITY): Payer: Self-pay

## 2020-02-20 ENCOUNTER — Other Ambulatory Visit (HOSPITAL_COMMUNITY)
Admission: RE | Admit: 2020-02-20 | Discharge: 2020-02-20 | Disposition: A | Discharge status: Home / Self Care | Payer: Medicare HMO | Source: Ambulatory Visit | Attending: General Surgery | Admitting: General Surgery

## 2020-02-20 ENCOUNTER — Other Ambulatory Visit: Payer: Self-pay

## 2020-02-20 ENCOUNTER — Encounter (HOSPITAL_COMMUNITY)
Admission: RE | Admit: 2020-02-20 | Discharge: 2020-02-20 | Disposition: A | Discharge status: Home / Self Care | Payer: Medicare HMO | Source: Ambulatory Visit | Attending: General Surgery | Admitting: General Surgery

## 2020-02-20 DIAGNOSIS — Z20822 Contact with and (suspected) exposure to covid-19: Secondary | ICD-10-CM | POA: Diagnosis not present

## 2020-02-20 DIAGNOSIS — Z01812 Encounter for preprocedural laboratory examination: Secondary | ICD-10-CM | POA: Insufficient documentation

## 2020-02-20 LAB — BASIC METABOLIC PANEL
Anion gap: 7 (ref 5–15)
BUN: 13 mg/dL (ref 8–23)
CO2: 25 mmol/L (ref 22–32)
Calcium: 8.2 mg/dL — ABNORMAL LOW (ref 8.9–10.3)
Chloride: 105 mmol/L (ref 98–111)
Creatinine, Ser: 0.94 mg/dL (ref 0.61–1.24)
GFR, Estimated: >60 mL/min (ref >60)
Glucose, Bld: 99 mg/dL (ref 70–99)
Potassium: 3.7 mmol/L (ref 3.5–5.1)
Sodium: 137 mmol/L (ref 135–145)

## 2020-02-20 LAB — SARS CORONAVIRUS 2 (TAT 6-24 HRS): SARS Coronavirus 2: NEGATIVE

## 2020-02-21 ENCOUNTER — Ambulatory Visit (HOSPITAL_COMMUNITY)
Admission: EM | Admit: 2020-02-21 | Discharge: 2020-03-05 | Disposition: E | Payer: Medicare HMO | Attending: Cardiology | Admitting: Cardiology

## 2020-02-21 ENCOUNTER — Ambulatory Visit (HOSPITAL_COMMUNITY): Admission: EM | Disposition: E | Payer: Self-pay | Source: Home / Self Care

## 2020-02-21 ENCOUNTER — Encounter (HOSPITAL_COMMUNITY): Payer: Self-pay | Admitting: Cardiology

## 2020-02-21 ENCOUNTER — Ambulatory Visit (HOSPITAL_COMMUNITY): Admit: 2020-02-21 | Payer: Medicare HMO | Admitting: Cardiology

## 2020-02-21 ENCOUNTER — Emergency Department (HOSPITAL_COMMUNITY): Payer: Medicare HMO | Admitting: Certified Registered Nurse Anesthetist

## 2020-02-21 ENCOUNTER — Encounter (HOSPITAL_COMMUNITY): Payer: Self-pay

## 2020-02-21 DIAGNOSIS — I255 Ischemic cardiomyopathy: Secondary | ICD-10-CM | POA: Diagnosis not present

## 2020-02-21 DIAGNOSIS — I4901 Ventricular fibrillation: Secondary | ICD-10-CM

## 2020-02-21 DIAGNOSIS — I5042 Chronic combined systolic (congestive) and diastolic (congestive) heart failure: Secondary | ICD-10-CM | POA: Diagnosis not present

## 2020-02-21 DIAGNOSIS — I2510 Atherosclerotic heart disease of native coronary artery without angina pectoris: Secondary | ICD-10-CM

## 2020-02-21 DIAGNOSIS — I712 Thoracic aortic aneurysm, without rupture: Secondary | ICD-10-CM | POA: Insufficient documentation

## 2020-02-21 DIAGNOSIS — E782 Mixed hyperlipidemia: Secondary | ICD-10-CM | POA: Diagnosis not present

## 2020-02-21 DIAGNOSIS — R570 Cardiogenic shock: Secondary | ICD-10-CM | POA: Diagnosis present

## 2020-02-21 DIAGNOSIS — I2102 ST elevation (STEMI) myocardial infarction involving left anterior descending coronary artery: Secondary | ICD-10-CM | POA: Diagnosis present

## 2020-02-21 DIAGNOSIS — I25110 Atherosclerotic heart disease of native coronary artery with unstable angina pectoris: Secondary | ICD-10-CM | POA: Diagnosis not present

## 2020-02-21 DIAGNOSIS — Z951 Presence of aortocoronary bypass graft: Secondary | ICD-10-CM | POA: Diagnosis not present

## 2020-02-21 DIAGNOSIS — R0902 Hypoxemia: Secondary | ICD-10-CM | POA: Diagnosis not present

## 2020-02-21 DIAGNOSIS — I10 Essential (primary) hypertension: Secondary | ICD-10-CM | POA: Diagnosis present

## 2020-02-21 DIAGNOSIS — I469 Cardiac arrest, cause unspecified: Secondary | ICD-10-CM | POA: Insufficient documentation

## 2020-02-21 DIAGNOSIS — R0789 Other chest pain: Secondary | ICD-10-CM | POA: Diagnosis not present

## 2020-02-21 DIAGNOSIS — I499 Cardiac arrhythmia, unspecified: Secondary | ICD-10-CM | POA: Diagnosis not present

## 2020-02-21 DIAGNOSIS — R079 Chest pain, unspecified: Secondary | ICD-10-CM | POA: Diagnosis not present

## 2020-02-21 DIAGNOSIS — I213 ST elevation (STEMI) myocardial infarction of unspecified site: Secondary | ICD-10-CM | POA: Diagnosis not present

## 2020-02-21 DIAGNOSIS — I110 Hypertensive heart disease with heart failure: Secondary | ICD-10-CM | POA: Diagnosis not present

## 2020-02-21 HISTORY — PX: LEFT HEART CATH AND CORONARY ANGIOGRAPHY: CATH118249

## 2020-02-21 HISTORY — PX: CORONARY/GRAFT ACUTE MI REVASCULARIZATION: CATH118305

## 2020-02-21 LAB — APTT: aPTT: 28 seconds (ref 24–36)

## 2020-02-21 LAB — LIPID PANEL
Cholesterol: 115 mg/dL (ref 0–200)
HDL: 36 mg/dL — ABNORMAL LOW (ref >40)
LDL Cholesterol: 59 mg/dL (ref 0–99)
Total CHOL/HDL Ratio: 3.2 RATIO
Triglycerides: 99 mg/dL (ref <150)
VLDL: 20 mg/dL (ref 0–40)

## 2020-02-21 LAB — CBC
HCT: 41.3 % (ref 39.0–52.0)
Hemoglobin: 14.2 g/dL (ref 13.0–17.0)
MCH: 30.3 pg (ref 26.0–34.0)
MCHC: 34.4 g/dL (ref 30.0–36.0)
MCV: 88.2 fL (ref 80.0–100.0)
Platelets: 214 10*3/uL (ref 150–400)
RBC: 4.68 MIL/uL (ref 4.22–5.81)
RDW: 12.5 % (ref 11.5–15.5)
WBC: 8.2 10*3/uL (ref 4.0–10.5)
nRBC: 0 % (ref 0.0–0.2)

## 2020-02-21 LAB — COMPREHENSIVE METABOLIC PANEL
ALT: 21 U/L (ref 0–44)
AST: 25 U/L (ref 15–41)
Albumin: 3.8 g/dL (ref 3.5–5.0)
Alkaline Phosphatase: 60 U/L (ref 38–126)
Anion gap: 14 (ref 5–15)
BUN: 15 mg/dL (ref 8–23)
CO2: 16 mmol/L — ABNORMAL LOW (ref 22–32)
Calcium: 8.7 mg/dL — ABNORMAL LOW (ref 8.9–10.3)
Chloride: 109 mmol/L (ref 98–111)
Creatinine, Ser: 1.06 mg/dL (ref 0.61–1.24)
GFR, Estimated: >60 mL/min (ref >60)
Glucose, Bld: 137 mg/dL — ABNORMAL HIGH (ref 70–99)
Potassium: 3.2 mmol/L — ABNORMAL LOW (ref 3.5–5.1)
Sodium: 139 mmol/L (ref 135–145)
Total Bilirubin: 1.1 mg/dL (ref 0.3–1.2)
Total Protein: 6.6 g/dL (ref 6.5–8.1)

## 2020-02-21 LAB — POCT I-STAT 7, (LYTES, BLD GAS, ICA,H+H)
Acid-base deficit: 12 mmol/L — ABNORMAL HIGH (ref 0.0–2.0)
Bicarbonate: 15 mmol/L — ABNORMAL LOW (ref 20.0–28.0)
Calcium, Ion: 0.97 mmol/L — ABNORMAL LOW (ref 1.15–1.40)
HCT: 40 % (ref 39.0–52.0)
Hemoglobin: 13.6 g/dL (ref 13.0–17.0)
O2 Saturation: 82 %
Potassium: 2.8 mmol/L — ABNORMAL LOW (ref 3.5–5.1)
Sodium: 129 mmol/L — ABNORMAL LOW (ref 135–145)
TCO2: 16 mmol/L — ABNORMAL LOW (ref 22–32)
pCO2 arterial: 38.2 mmHg (ref 32.0–48.0)
pH, Arterial: 7.202 — ABNORMAL LOW (ref 7.350–7.450)
pO2, Arterial: 57 mmHg — ABNORMAL LOW (ref 83.0–108.0)

## 2020-02-21 LAB — POCT I-STAT, CHEM 8
BUN: 14 mg/dL (ref 8–23)
Calcium, Ion: 1.1 mmol/L — ABNORMAL LOW (ref 1.15–1.40)
Chloride: 109 mmol/L (ref 98–111)
Creatinine, Ser: 0.8 mg/dL (ref 0.61–1.24)
Glucose, Bld: 140 mg/dL — ABNORMAL HIGH (ref 70–99)
HCT: 41 % (ref 39.0–52.0)
Hemoglobin: 13.9 g/dL (ref 13.0–17.0)
Potassium: 3.3 mmol/L — ABNORMAL LOW (ref 3.5–5.1)
Sodium: 141 mmol/L (ref 135–145)
TCO2: 16 mmol/L — ABNORMAL LOW (ref 22–32)

## 2020-02-21 LAB — PROTIME-INR
INR: 1 (ref 0.8–1.2)
Prothrombin Time: 12.9 seconds (ref 11.4–15.2)

## 2020-02-21 LAB — TROPONIN I (HIGH SENSITIVITY): Troponin I (High Sensitivity): 15 ng/L (ref <18)

## 2020-02-21 LAB — POCT ACTIVATED CLOTTING TIME: Activated Clotting Time: 175 seconds

## 2020-02-21 SURGERY — LEFT HEART CATH AND CORONARY ANGIOGRAPHY
Anesthesia: LOCAL

## 2020-02-21 MED ORDER — ONDANSETRON HCL 4 MG/2ML IJ SOLN
INTRAMUSCULAR | Status: AC
  Filled 2020-02-21: qty 2

## 2020-02-21 MED ORDER — VERAPAMIL HCL 2.5 MG/ML IV SOLN
INTRAVENOUS | Status: AC
  Filled 2020-02-21: qty 2

## 2020-02-21 MED ORDER — SODIUM BICARBONATE 8.4 % IV SOLN
INTRAVENOUS | Status: AC
  Filled 2020-02-21: qty 50

## 2020-02-21 MED ORDER — EPINEPHRINE 1 MG/10ML IJ SOSY
PREFILLED_SYRINGE | INTRAMUSCULAR | Status: AC
  Filled 2020-02-21: qty 10

## 2020-02-21 MED ORDER — SODIUM CHLORIDE 0.9 % IV SOLN
INTRAVENOUS | Status: AC
  Filled 2020-02-21: qty 250

## 2020-02-21 MED ORDER — AMIODARONE HCL 150 MG/3ML IV SOLN
INTRAVENOUS | Status: AC
  Filled 2020-02-21: qty 3

## 2020-02-21 MED ORDER — AMIODARONE LOAD VIA INFUSION
150.0000 mg | Freq: Once | INTRAVENOUS | Status: DC
  Filled 2020-02-21: qty 83.34

## 2020-02-21 MED ORDER — AMIODARONE LOAD VIA INFUSION
INTRAVENOUS | Status: DC | PRN
  Administered 2020-02-21 (×4): 150 mg via INTRAVENOUS

## 2020-02-21 MED ORDER — NOREPINEPHRINE BITARTRATE 1 MG/ML IV SOLN
INTRAVENOUS | Status: AC | PRN
  Administered 2020-02-21: 40 ug/min via INTRAVENOUS

## 2020-02-21 MED ORDER — LIDOCAINE HCL (PF) 1 % IJ SOLN
INTRAMUSCULAR | Status: AC
  Filled 2020-02-21: qty 30

## 2020-02-21 MED ORDER — HEPARIN SODIUM (PORCINE) 1000 UNIT/ML IJ SOLN
INTRAMUSCULAR | Status: AC
  Filled 2020-02-21: qty 1

## 2020-02-21 MED ORDER — EPINEPHRINE PF 1 MG/ML IJ SOLN
INTRAMUSCULAR | Status: AC
  Filled 2020-02-21: qty 1

## 2020-02-21 MED ORDER — LIDOCAINE HCL (CARDIAC) PF 100 MG/5ML IV SOSY
PREFILLED_SYRINGE | INTRAVENOUS | Status: AC
  Filled 2020-02-21: qty 5

## 2020-02-21 MED ORDER — ONDANSETRON HCL 4 MG/2ML IJ SOLN
INTRAMUSCULAR | Status: DC | PRN
  Administered 2020-02-21: 4 mg via INTRAVENOUS

## 2020-02-21 MED ORDER — HEPARIN (PORCINE) IN NACL 1000-0.9 UT/500ML-% IV SOLN
INTRAVENOUS | Status: DC | PRN
  Administered 2020-02-21 (×2): 500 mL

## 2020-02-21 MED ORDER — AMIODARONE HCL IN DEXTROSE 360-4.14 MG/200ML-% IV SOLN
INTRAVENOUS | Status: AC | PRN
  Administered 2020-02-21: 60 mg/h via INTRAVENOUS

## 2020-02-21 MED ORDER — HEPARIN (PORCINE) IN NACL 1000-0.9 UT/500ML-% IV SOLN
INTRAVENOUS | Status: AC
  Filled 2020-02-21: qty 1000

## 2020-02-21 MED ORDER — ATROPINE SULFATE 1 MG/10ML IJ SOSY
PREFILLED_SYRINGE | INTRAMUSCULAR | Status: DC | PRN
  Administered 2020-02-21: 1 mg via INTRAVENOUS

## 2020-02-21 MED ORDER — VERAPAMIL HCL 2.5 MG/ML IV SOLN
INTRAVENOUS | Status: DC | PRN
  Administered 2020-02-21: 10 mL via INTRA_ARTERIAL

## 2020-02-21 MED ORDER — SUCCINYLCHOLINE CHLORIDE 20 MG/ML IJ SOLN
INTRAMUSCULAR | Status: DC | PRN
  Administered 2020-02-21: 80 mg via INTRAVENOUS

## 2020-02-21 MED ORDER — LIDOCAINE HCL (CARDIAC) PF 100 MG/5ML IV SOSY
PREFILLED_SYRINGE | INTRAVENOUS | Status: DC | PRN
  Administered 2020-02-21 (×2): 100 mg via INTRAVENOUS

## 2020-02-21 MED ORDER — STERILE WATER FOR INJECTION IV SOLN
INTRAVENOUS | Status: DC
  Filled 2020-02-21: qty 850

## 2020-02-21 MED ORDER — AMIODARONE HCL IN DEXTROSE 360-4.14 MG/200ML-% IV SOLN
30.0000 mg/h | INTRAVENOUS | Status: DC
  Administered 2020-02-21: 30 mg/h via INTRAVENOUS

## 2020-02-21 MED ORDER — EPINEPHRINE 1 MG/10ML IJ SOSY
PREFILLED_SYRINGE | INTRAMUSCULAR | Status: DC | PRN
  Administered 2020-02-21 (×6): 1 via INTRAVENOUS

## 2020-02-21 MED ORDER — ETOMIDATE 2 MG/ML IV SOLN
INTRAVENOUS | Status: DC | PRN
  Administered 2020-02-21: 10 mg via INTRAVENOUS

## 2020-02-21 MED ORDER — TICAGRELOR 90 MG PO TABS
ORAL_TABLET | ORAL | Status: AC
  Filled 2020-02-21: qty 1

## 2020-02-21 MED ORDER — LIDOCAINE HCL (PF) 1 % IJ SOLN
INTRAMUSCULAR | Status: DC | PRN
  Administered 2020-02-21: 2 mL via INTRADERMAL

## 2020-02-21 MED ORDER — IOHEXOL 350 MG/ML SOLN
INTRAVENOUS | Status: DC | PRN
  Administered 2020-02-21: 100 mL

## 2020-02-21 MED ORDER — LIDOCAINE HCL (CARDIAC) PF 100 MG/5ML IV SOSY
PREFILLED_SYRINGE | INTRAVENOUS | Status: DC | PRN
  Administered 2020-02-21: 60 mg via INTRAVENOUS

## 2020-02-21 MED ORDER — ATROPINE SULFATE 1 MG/10ML IJ SOSY
PREFILLED_SYRINGE | INTRAMUSCULAR | Status: AC
  Filled 2020-02-21: qty 10

## 2020-02-21 MED ORDER — IOHEXOL 350 MG/ML SOLN
INTRAVENOUS | Status: AC
  Filled 2020-02-21: qty 1

## 2020-02-21 MED ORDER — AMIODARONE HCL IN DEXTROSE 360-4.14 MG/200ML-% IV SOLN: 60.0000 mg/h | INTRAVENOUS | Status: DC

## 2020-02-21 MED ORDER — TICAGRELOR 90 MG PO TABS
ORAL_TABLET | ORAL | Status: DC | PRN
  Administered 2020-02-21: 180 mg via ORAL

## 2020-02-21 MED ORDER — SODIUM BICARBONATE 8.4 % IV SOLN
INTRAVENOUS | Status: DC | PRN
  Administered 2020-02-21 (×4): 100 meq via INTRAVENOUS

## 2020-02-21 MED ORDER — FENTANYL 2500MCG IN NS 250ML (10MCG/ML) PREMIX INFUSION: 0.0000 ug/h | INTRAVENOUS | Status: DC

## 2020-02-21 MED ORDER — NOREPINEPHRINE 4 MG/250ML-% IV SOLN
INTRAVENOUS | Status: AC
  Filled 2020-02-21: qty 250

## 2020-02-21 MED ORDER — FENTANYL 2500MCG IN NS 250ML (10MCG/ML) PREMIX INFUSION
INTRAVENOUS | Status: AC
  Filled 2020-02-21: qty 250

## 2020-02-21 SURGICAL SUPPLY — 13 items
CATH 5FR JL3.5 JR4 ANG PIG MP (CATHETERS) ×1 IMPLANT
CATH VISTA GUIDE 6FR XBLAD3.5 (CATHETERS) ×1 IMPLANT
DEVICE RAD COMP TR BAND LRG (VASCULAR PRODUCTS) ×1 IMPLANT
GLIDESHEATH SLEND SS 6F .021 (SHEATH) ×1 IMPLANT
GUIDEWIRE INQWIRE 1.5J.035X260 (WIRE) IMPLANT
INQWIRE 1.5J .035X260CM (WIRE) ×2
KIT HEART LEFT (KITS) ×2 IMPLANT
PACK CARDIAC CATHETERIZATION (CUSTOM PROCEDURE TRAY) ×2 IMPLANT
SET IMPELLA CP PUMP (CATHETERS) ×1 IMPLANT
SHEATH PINNACLE 7F 10CM (SHEATH) ×1 IMPLANT
SYR MEDRAD MARK 7 150ML (SYRINGE) ×2 IMPLANT
TRANSDUCER W/STOPCOCK (MISCELLANEOUS) ×2 IMPLANT
TUBING CIL FLEX 10 FLL-RA (TUBING) ×2 IMPLANT

## 2020-02-22 ENCOUNTER — Ambulatory Visit (HOSPITAL_COMMUNITY): Admission: RE | Admit: 2020-02-22 | Payer: Medicare HMO | Source: Home / Self Care | Admitting: General Surgery

## 2020-02-22 ENCOUNTER — Encounter (HOSPITAL_COMMUNITY): Admission: RE | Payer: Self-pay | Source: Home / Self Care

## 2020-02-22 ENCOUNTER — Encounter (HOSPITAL_COMMUNITY): Payer: Self-pay | Admitting: Cardiology

## 2020-02-22 LAB — HEMOGLOBIN A1C
Hgb A1c MFr Bld: 5.1 % (ref 4.8–5.6)
Mean Plasma Glucose: 100 mg/dL

## 2020-02-22 SURGERY — REPAIR, HERNIA, INGUINAL, ADULT
Anesthesia: General | Laterality: Left

## 2020-02-23 MED FILL — Heparin Sodium (Porcine) Inj 1000 Unit/ML: INTRAMUSCULAR | Qty: 10 | Status: AC

## 2020-02-29 ENCOUNTER — Telehealth: Payer: Self-pay | Admitting: Cardiology

## 2020-02-29 NOTE — Telephone Encounter (Signed)
[name] with [facility] advised that Patrick Weber is taking it over to the hosp now for Dr. [place] to sign and it will be available this afternoon.   She has requested that we fax it to them:   [phone]

## 2020-02-29 NOTE — Telephone Encounter (Signed)
° ° °  [name] from [employer] [facility] calling, she said she faxed over death certificate to Dr. Sw[place]iland yesterday for him to sign. She said if she can get it today.

## 2020-03-01 NOTE — Telephone Encounter (Signed)
Patient death certificate completed and signed by Dr. [place] on 10/27. Along with faxed and confirmed to funeral home. original to be scanned in patient chart.

## 2020-03-02 ENCOUNTER — Ambulatory Visit: Payer: Medicare HMO | Admitting: Urology

## 2020-03-05 NOTE — Anesthesia Procedure Notes (Signed)
Procedure Name: Intubation Date/Time: March 21, 2020 11:33 AM Performed by: Jed Limerick, CRNA Pre-anesthesia Checklist: Patient identified, Emergency Drugs available, Suction available and Patient being monitored Patient Re-evaluated:Patient Re-evaluated prior to induction Oxygen Delivery Method: Circle System Utilized Preoxygenation: Pre-oxygenation with 100% oxygen Induction Type: IV induction and Rapid sequence Laryngoscope Size: Glidescope and 4 Grade View: Grade I Tube type: Oral Tube size: 7.5 mm Number of attempts: 1 Airway Equipment and Method: Video-laryngoscopy and Rigid stylet Placement Confirmation: ETT inserted through vocal cords under direct vision,  positive ETCO2 and breath sounds checked- equal and bilateral Secured at: 22 (at lip) cm Tube secured with: Tape Dental Injury: Teeth and Oropharynx as per pre-operative assessment

## 2020-03-05 NOTE — Death Summary Note (Signed)
Death Summary    Patient ID: Patrick Weber MRN: [id number]; DOB: 01-14-1946  Admit Date: March 18, 2020 Date of Death: 2020-03-18  Primary Care Provider: [name], FNP  Primary Cardiologist: [name], MD   Discharge Diagnoses    Principal Problem:   Acute ST elevation myocardial infarction (STEMI) due to occlusion of left anterior descending (LAD) coronary artery [facility]) Active Problems:   Mixed hyperlipidemia   Ischemic cardiomyopathy   Essential hypertension   Cardiogenic shock (HCC)   Cardiac arrest.   Diagnostic Studies/Procedures    Coronary/Graft Acute MI Revascularization  LEFT HEART CATH AND CORONARY ANGIOGRAPHY  Conclusion    Prox LAD to Mid LAD lesion is 100% stenosed.  Prox RCA lesion is 10% stenosed.  Mid RCA to Dist RCA lesion is 15% stenosed.  LV end diastolic pressure is moderately elevated.   1. Acute anteroseptal STEMI due to acute stent thrombosis.  2. Refractory cardiogenic shock and PEA arrest with resultant death.   Diagnostic Dominance: Right     History of Present Illness     Patrick Weber is a 74 y.o. male with hx of CAD, chronic combined CHF/ICM, HLD, HLD and thoracic aortic aneurysm presented with CODE Anterior STEMI by [facility].   History of prior inferior STEMI in 2018 with RCA stent x 2, recent hospitalization 07/2019 for chest pain and acute on chronic systolic HF with drop in EF, subsequently found to have high grade proximal and mid LAD disease by cath 07/21/2019 now s/p PCI, with patent RCA stent from prior. Follow up echocardiogram 11/24/2019 showed improvement in EF though still reduced, 40-45%. Dilated ascending aorta.  Seen by Dr. Jacques Navy 02/03/2020 for surgical clearance for left inguinal hernia repair. Cleared with intermidiate risk. Okay to hold Brilinta for 5 days.   Hospital Course   The patient was directly taken to cath lab for emergent angiography which showed acute occluded LAD stent thrombosis.  While on cath table, patient went into VT/VF arrest requiring 5-6 shocks and amiodarone bolus with ROSC on 3 minutes however before proceeded with angiography/intertension he went again in to VT/VF PEA arrest with multiple shock and resuscitation attempts for 17 minutes without success. He was pronounced death on 04-18-20 @ 11:46am by Dr. Swazil[place].   Consultants: Anesthesiologist for intubation/cubsided critical care   Time of death: 11:46am  Labs & Radiologic Studies    CBC Recent Labs    March 18, 2020 1130  WBC 8.2  HGB 14.2  HCT 41.3  MCV 88.2  PLT 214   Basic Metabolic Panel Recent Labs    09/32/35 1346 Mar 18, 2020 1130  NA 137 139  K 3.7 3.2*  CL 105 109  CO2 25 16*  GLUCOSE 99 137*  BUN 13 15  CREATININE 0.94 1.06  CALCIUM 8.2* 8.7*   Liver Function Tests Recent Labs    2020-03-18 1130  AST 25  ALT 21  ALKPHOS 60  BILITOT 1.1  PROT 6.6  ALBUMIN 3.8   High Sensitivity Troponin:   Recent Labs  Lab 03-18-20 1130  TROPONINIHS 15    _____________  CARDIAC CATHETERIZATION  Result Date: 2020/03/18  Prox LAD to Mid LAD lesion is 100% stenosed.  Prox RCA lesion is 10% stenosed.  Mid RCA to Dist RCA lesion is 15% stenosed.  LV end diastolic pressure is moderately elevated.  1. Acute anteroseptal STEMI due to acute stent thrombosis. 2. Refractory cardiogenic shock and PEA arrest with resultant death.   Duration of Death Summary Encounter   Greater than  30 minutes including physician time.  Patrick Pont, PA 03-04-20, 1:07 PM

## 2020-03-05 NOTE — Progress Notes (Signed)
°   03/08/2020 1154  Clinical Encounter Type  Visited With Patient not available;Health care provider  Visit Type Death  Referral From Nurse  Consult/Referral To Chaplain  Spiritual Encounters  Spiritual Needs Literature  The chaplain responded to a call from the unit secretary Clydie Braun). The patient in CATH Lab 3 passed away. The chaplain provided the doctor with a patient placement card. There was no family present. The doctor spoke with the patient's youngest son. No funeral home information is known at this time. The son will pick up his father's personal belongings. The chaplain will follow up as needed.

## 2020-03-05 NOTE — Progress Notes (Signed)
RT note-Called to set up ventilator for patient intubated in Cath Lab. Patient intubated by anesthesia and was initially placed on ventilator with 8cc/kg, rate 18, fio2 100%, PEEP +5. Then taken off and bagged with ambu bag during CPR.

## 2020-03-05 NOTE — Progress Notes (Signed)
Patient's belonging were given to the son Patrick Weber at [facility]. Pt's belongings included his clothes, wallet and cell phone. Son was also given a card for patient placement so they could give funeral home details.  Condolences were also expressed to the family. Family was also told to call our office if we could be of any more assistance. Family very thankful for all of our care.

## 2020-03-05 NOTE — H&P (Addendum)
Cardiology Admission History and Physical:   Patient ID: Patrick Weber MRN: [id number]; DOB: [birth year]   Admission date: Mar 20, 2020  Primary Care Provider: [name], FNP [facility] Cardiologist: [name], MD    Chief Complaint:  CP/STEMI  Patient Profile:   Patrick Weber is a 74 y.o. male with hx of CAD, chronic combined CHF/ICM, HLD, HLD and thoracic aortic aneurysm presented with CODE STEMI by [facility].   History of prior inferior STEMI in 2018 with RCA stent x 2, recent hospitalization 07/2019 for chest pain and acute on chronic systolic HF with drop in EF, subsequently found to have high grade proximal and mid LAD disease by cath 07/21/2019 now s/p PCI, with patent RCA stent from prior. Follow up echocardiogram 11/24/2019 showed improvement in EF though still reduced, 40-45%. Dilated ascending aorta.  Seen by Dr. Jacques Navy 02/03/2020 for surgical clearance for  left inguinal hernia repair. Cleared with intermidiate risk. Okay to hold Brilinta for 5 days.   History of Present Illness:   Patrick Weber is scheduled for left inguinal surgery tomorrow and been holding his Brilinta however had sudden onset chest pressure 60-90s minutes prior to arrival. EKG was called and STEMI code is activated. He was in distress en route requiring oxygen.   Had negative COVID test yesterday for his surgery. Renal function and electrolytes was normal.    Past Medical History:  Diagnosis Date  . Acute ST elevation myocardial infarction (STEMI) of inferior wall (HCC) 09/18/2016  . 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   . Hyperlipemia     Past Surgical History:  Procedure Laterality Date  . CORONARY BALLOON ANGIOPLASTY N/A 07/22/2019   Procedure: CORONARY BALLOON ANGIOPLASTY;  Surgeon: [name], MD;  Location: [facility] INVASIVE CV  LAB;  Service: Cardiovascular;  Laterality: N/A;  . CORONARY STENT INTERVENTION N/A 07/22/2019   Procedure: CORONARY STENT INTERVENTION;  Surgeon: [name], MD;  Location: [facility] INVASIVE CV LAB;  Service: Cardiovascular;  Laterality: N/A;  . LEFT HEART CATH AND CORONARY ANGIOGRAPHY N/A 09/18/2016   Procedure: Left Heart Cath and Coronary Angiography;  Surgeon: [name], MD;  Location: [facility] INVASIVE CV LAB;  Service: Cardiovascular;  Laterality: N/A;  . LEFT HEART CATH AND CORONARY ANGIOGRAPHY N/A 07/22/2019   Procedure: LEFT HEART CATH AND CORONARY ANGIOGRAPHY;  Surgeon: [name], MD;  Location: [facility] INVASIVE CV LAB;  Service: Cardiovascular;  Laterality: N/A;     Medications Prior to Admission: Prior to Admission medications   Medication Sig Start Date End Date Taking? Authorizing Provider  acetaminophen (TYLENOL) 500 MG tablet Take 1 tablet (500 mg total) by mouth every 6 (six) hours as needed. Patient taking differently: Take 500 mg by mouth every 6 (six) hours as needed for moderate pain or headache.  01/13/20   Daryll Drown, NP  aspirin EC 81 MG tablet Take 81 mg by mouth daily. Swallow whole.    [provider]  atorvastatin (LIPITOR) 80 MG tablet Take 1 tablet (80 mg total) by mouth daily. 07/23/19 02/09/20  [place], Alvira Philips, DO  Carboxymethylcellul-Glycerin (LUBRICATING EYE DROPS OP) Place 1 drop into both eyes daily as needed (dry eyes).    [provider]  ezetimibe (ZETIA) 10 MG tablet Take 1 tablet (10 mg total) by mouth daily. 07/23/19 02/09/20  [place], Alvira Philips, DO  furosemide (LASIX) 40  MG tablet Take 1 tablet (40 mg total) by mouth daily. 12/27/19 03/26/20  [name], MD  metoprolol succinate (TOPROL-XL) 50 MG 24 hr tablet Take 1 tablet (50 mg total) by mouth daily. 10/05/19 02/09/20  Ronney Asters, NP  nitroGLYCERIN (NITROSTAT) 0.4 MG SL tablet Place 1 tablet (0.4 mg total) under the tongue every 5 (five) minutes x 3 doses as needed for  chest pain. 07/28/19   Ronney Asters, NP  sacubitril-valsartan (ENTRESTO) 97-103 MG Take 1 tablet by mouth 2 (two) times daily. 10/05/19   Ronney Asters, NP  spironolactone (ALDACTONE) 25 MG tablet Take 12.5 mg by mouth daily.    [provider]  ticagrelor (BRILINTA) 90 MG TABS tablet Take 1 tablet (90 mg total) by mouth 2 (two) times daily. 08/29/19   Ronney Asters, NP     Allergies:    Allergies  Allergen Reactions  . Lidocaine Other (See Comments)    Makes him sleepy.    Social History:   Social History   Socioeconomic History  . Marital status: Widowed    Spouse name: Not on file  . Number of children: 4  . Years of education: Not on file  . Highest education level: Not on file  Occupational History  . Not on file  Tobacco Use  . Smoking status: Never Smoker  . Smokeless tobacco: Never Used  Vaping Use  . Vaping Use: Never used  Substance and Sexual Activity  . Alcohol use: Not Currently  . Drug use: No  . Sexual activity: Not on file  Other Topics Concern  . Not on file  Social History Narrative   Lives at home - youngest son lives with him    Social Determinants of Health   Financial Resource Strain:   . Difficulty of Paying Living Expenses: Not on file  Food Insecurity:   . Worried About Programme researcher, broadcasting/film/video in the Last Year: Not on file  . Ran Out of Food in the Last Year: Not on file  Transportation Needs:   . Lack of Transportation (Medical): Not on file  . Lack of Transportation (Non-Medical): Not on file  Physical Activity:   . Days of Exercise per Week: Not on file  . Minutes of Exercise per Session: Not on file  Stress:   . Feeling of Stress : Not on file  Social Connections:   . Frequency of Communication with Friends and Family: Not on file  . Frequency of Social Gatherings with Friends and Family: Not on file  . Attends Religious Services: Not on file  . Active Member of Clubs or Organizations: Not on file  . Attends Tax inspector Meetings: Not on file  . Marital Status: Not on file  Intimate Partner Violence:   . Fear of Current or Ex-Partner: Not on file  . Emotionally Abused: Not on file  . Physically Abused: Not on file  . Sexually Abused: Not on file    Family History:  The patient's family history includes Emphysema in his father; Heart disease in his mother; Other in his son.    ROS:  Please see the history of present illness.  All other ROS reviewed and negative.     Physical Exam/Data:   Vitals:   03/08/2020 1112  SpO2: 100%   No intake or output data in the 24 hours ending 2020-03-08 1133 Last 3 Weights 02/20/2020 02/09/2020 02/03/2020  Weight (lbs) 180 lb 180 lb 181 lb 9.6 oz  Weight (kg) 81.647 kg 81.647 kg 82.373 kg     There is no height or weight on file to calculate BMI.  General:  Well nourished, well developed, in no acute distres HEENT: normal Lymph: no adenopathy Neck: noJVD Endocrine:  No thryomegaly Vascular: No carotid bruits; FA pulses 2+ bilaterally without bruits  Cardiac:  normal S1, S2; RRR; no murmur Lungs:  clear to auscultation bilaterally, no wheezing, rhonchi or rales  Abd: soft, nontender, no hepatomegaly  Ext: no edema Musculoskeletal:  No deformities, BUE and BLE strength normal and equal Skin: warm and dry  Neuro:  CNs 2-12 intact, no focal abnormalities noted Psych:  Normal affect    EKG:  The ECG that was done today was personally reviewed and demonstrates STEMI - detained per MD  Relevant CV Studies: Echo 11/24/2019 1. Left ventricular ejection fraction, by estimation, is 40-45% (48% by  3D ). The left ventricle has normal function. The left ventricle has no  regional wall motion abnormalities. The average left ventricular global  longitudinal strain is normal at  -16.7 %. The global longitudinal strain is normal.  2. Right ventricular systolic function is normal. The right ventricular  size is mildly enlarged. There is normal pulmonary artery  systolic  pressure.  3. The mitral valve is normal in structure. Mild mitral valve  regurgitation. No evidence of mitral stenosis.  4. The aortic valve is tricuspid. Aortic valve regurgitation is mild.  Mild aortic valve sclerosis is present, with no evidence of aortic valve  stenosis. Aortic regurgitation PHT measures 618 msec.  5. The inferior vena cava is normal in size with greater than 50%  respiratory variability, suggesting right atrial pressure of 3 mmHg.  6. Aortic dilatation noted. There is moderate dilatation of the aortic  root and of the ascending aorta measuring 45 mm and 39mm respsectively.   CORONARY BALLOON ANGIOPLASTY  CORONARY STENT INTERVENTION  LEFT HEART CATH AND CORONARY ANGIOGRAPHY  Conclusion    LM lesion is 20% stenosed.  Ost 1st Diag lesion is 30% stenosed.  Ost 3rd Diag lesion is 60% stenosed.  Prox LAD to Mid LAD lesion is 85% stenosed.  Ramus lesion is 15% stenosed.  Ost RCA lesion is 30% stenosed.  Previously placed Dist RCA drug eluting stent is widely patentwidely patent.  Prox RCA to Mid RCA lesion is 10% stenosed.  Mid RCA lesion is 50% stenosed.  1st Diag lesion is 80% stenosed.  2nd Diag lesion is 90% stenosed.  There is severe left ventricular systolic dysfunction.  LV end diastolic pressure is normal.  Post intervention, there is a 20% residual stenosis.  Balloon angioplasty was performed using a BALLOON SAPPHIRE 2.5X12.  Post intervention, there is a 30% residual stenosis.  Balloon angioplasty was performed using a BALLOON SAPPHIRE 2.5X12.  Post intervention, there is a 0% residual stenosis.  A drug-eluting stent was successfully placed using a STENT RESOLUTE ONYX 3.0X34.   1.  Significant underlying two-vessel coronary artery disease with widely patent RCA stent with no significant restenosis.  There is moderate mid RCA stenosis just distal to the previously placed stent.  This does not appear to be flow-limiting.   Progression of complex moderately calcified proximal to mid LAD disease involving 2 large diagonals which had significant ostial stenosis. 2.  Severely reduced LV systolic function with an EF of 25%.  Normal LVEDP at 8 mmHg. 3.  Successful complex bifurcation PCI with balloon angioplasty of 2 ostial diagonal branches followed by drug-eluting stent placement to  the proximal/mid LAD with excellent results.  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  Recommend aggressive medical therapy. Continue dual antiplatelet therapy for at least 12 months and preferably indefinitely. I switched metoprolol tartrate to metoprolol succinate given cardiomyopathy. If blood pressure allows, consider switching losartan to Entresto and adding spironolactone. The patient does not appear to be volume overloaded and I switch him from IV to oral furosemide. The burden of PVCs is concerning and might be contributing to his cardiomyopathy.  Consider EP evaluation.    Laboratory Data:     Chemistry Recent Labs  Lab 02/20/20 1346  NA 137  K 3.7  CL 105  CO2 25  GLUCOSE 99  BUN 13  CREATININE 0.94  CALCIUM 8.2*  GFRNONAA >60  ANIONGAP 7      Radiology/Studies:  No results found.   Assessment and Plan:   1. STEMI - STEMI while holding Brilinta for inguinal surgery tomorrow. Last PCI in 07/22/2019. Had VT/VF during cath requiring multiple shock and intubation.   2. Chronic combined CHF - Echocardiogram 11/24/2019 showed improvement in LVEF to 40-45% from 20-25%.    TIMI Risk Score for ST  Elevation MI:   The patient's TIMI risk score is 6, which indicates a 16.1% risk of all cause mortality at 30 days.   New York Heart Association (NYHA) Functional Class NYHA Class II  Severity of Illness: The appropriate patient status for this patient is INPATIENT. Inpatient status is judged to be reasonable and necessary in order to provide the required  intensity of service to ensure the patient's safety. The patient's presenting symptoms, physical exam findings, and initial radiographic and laboratory data in the context of their chronic comorbidities is felt to place them at high risk for further clinical deterioration. Furthermore, it is not anticipated that the patient will be medically stable for discharge from the hospital within 2 midnights of admission. The following factors support the patient status of inpatient.   " The patient's presenting symptoms include CP and SOB. " The worrisome physical exam findings include N/A " The initial radiographic and laboratory data are worrisome because of STEMI on EKG " The chronic co-morbidities include prior STEMI, CHF, requiring surgery    * I certify that at the point of admission it is my clinical judgment that the patient will require inpatient hospital care spanning beyond 2 midnights from the point of admission due to high intensity of service, high risk for further deterioration and high frequency of surveillance required.*    For questions or updates, please contact [facility] Please consult [email or web address] for contact info under     Lorelei PontSigned, Keary Waterson, PA  09/26/2019 11:33 AM

## 2020-03-05 DEATH — deceased

## 2020-03-22 ENCOUNTER — Telehealth: Payer: Self-pay | Admitting: Cardiology

## 2020-03-22 NOTE — Telephone Encounter (Signed)
03/22/2020 Received original signed copy of Death Certificate back from Dr. [place] I made copy for our records to send to scan center and mailed the original to [facility] here in [place], [place].  cbr

## 2020-04-11 ENCOUNTER — Ambulatory Visit: Payer: Medicare HMO | Admitting: Urology

## 2020-04-13 ENCOUNTER — Ambulatory Visit: Payer: Medicare HMO | Admitting: Nurse Practitioner

## 2020-05-27 IMAGING — CT CT ANGIOGRAPHY CHEST
2 of 7 series · 19 of 46 positions shown · IV contrast (OMNIPAQUE 350)
Comparison: 10/16/2017

CLINICAL DATA: Follow-up thoracic aortic aneurysm

EXAM:
CT ANGIOGRAPHY CHEST WITH CONTRAST
TECHNIQUE: Multidetector CT imaging of the chest was performed using the
standard protocol during bolus administration of intravenous
contrast. Multiplanar CT image reconstructions and MIPs were
obtained to evaluate the vascular anatomy.
CONTRAST:  100mL OMNIPAQUE IOHEXOL 350 MG/ML SOLN

[Series 4: aorta 3.0 i31f 2 · axial · 0.77mm/px · z∈[-315,-15]mm · 16 of 110 slices shown]
[im 5/110  lung]
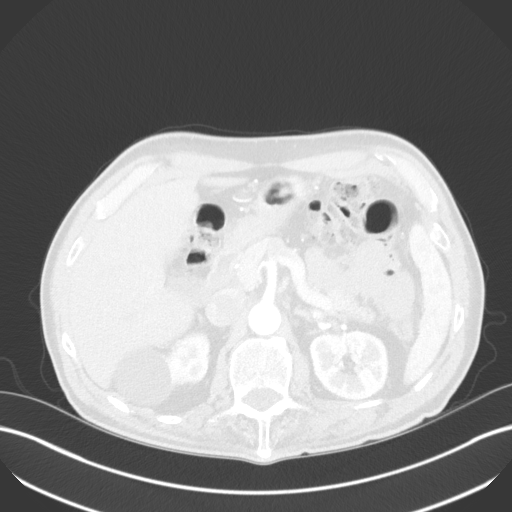
[im 13/110  soft-tissue]
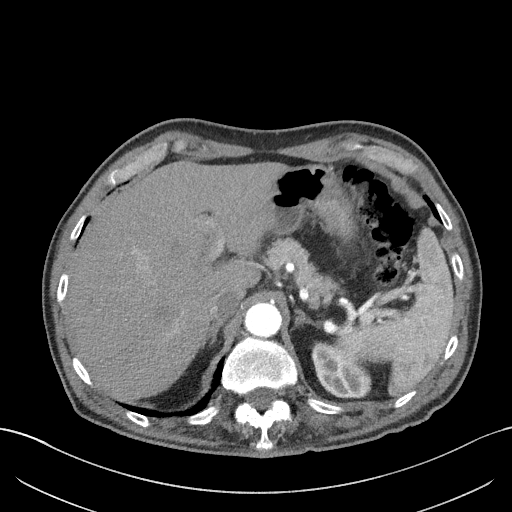
[im 21/110  lung]
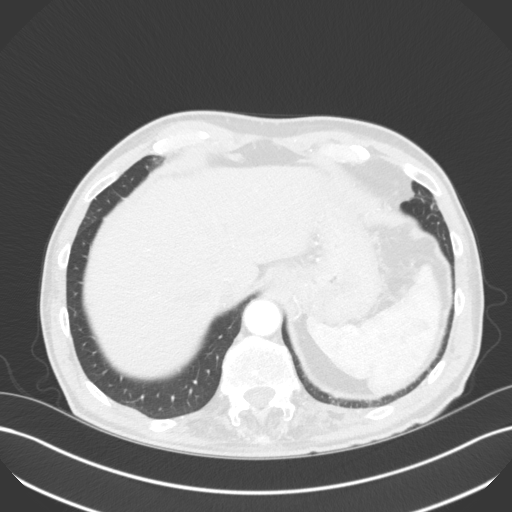
[im 25/110  soft-tissue]
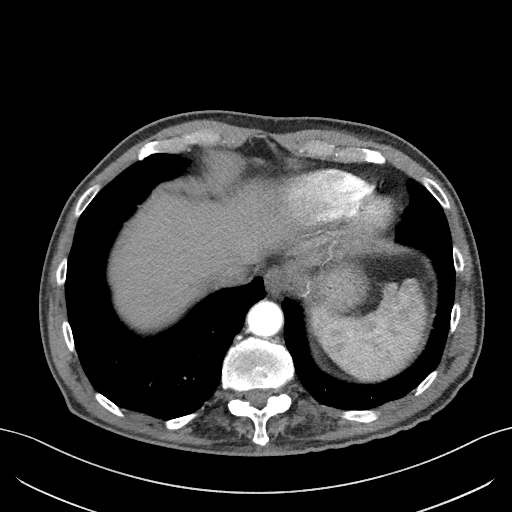
[im 33/110  lung]
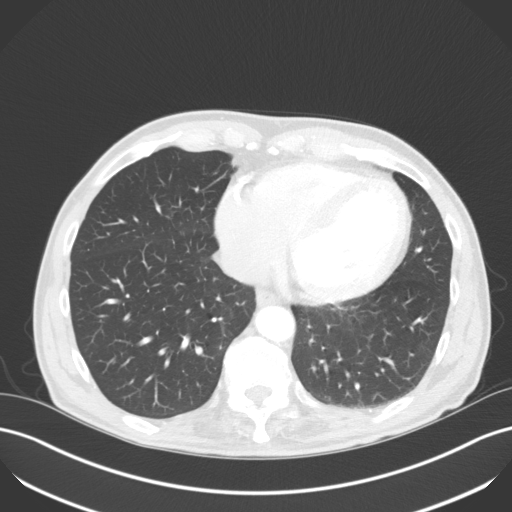
[im 37/110  soft-tissue]
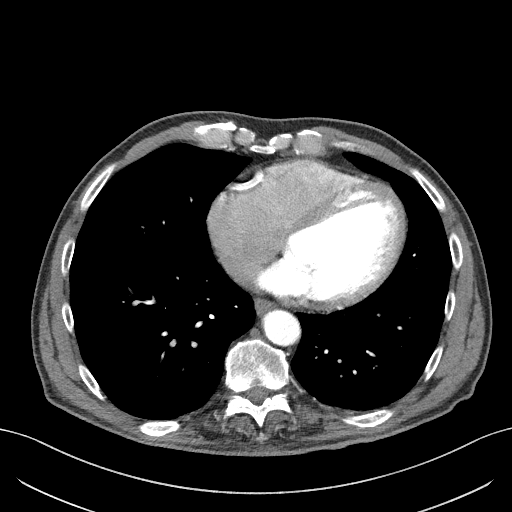
[im 45/110  lung]
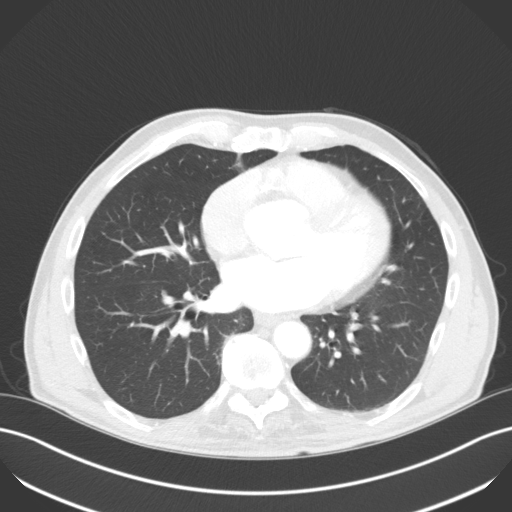
[im 53/110  soft-tissue]
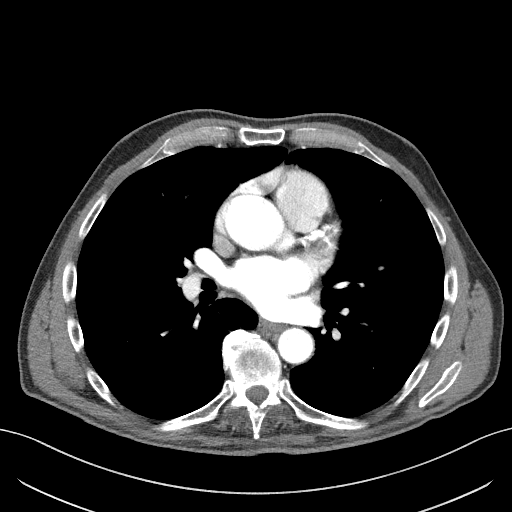
[im 57/110  lung]
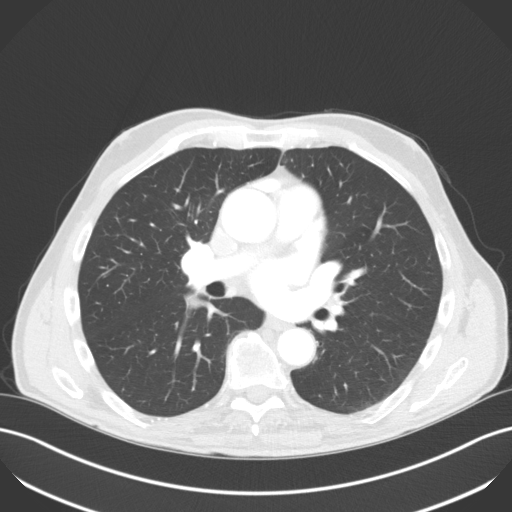
[im 65/110  soft-tissue]
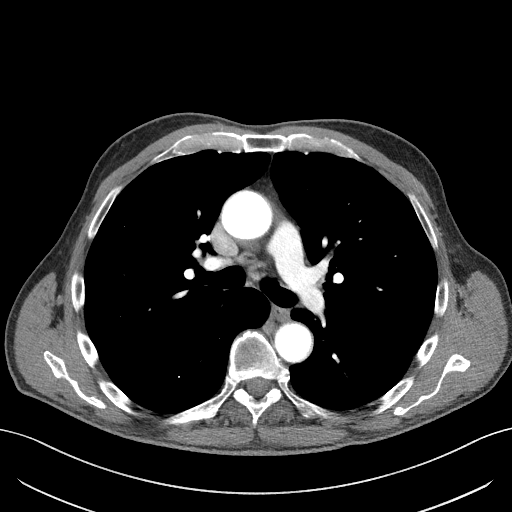
[im 73/110  lung]
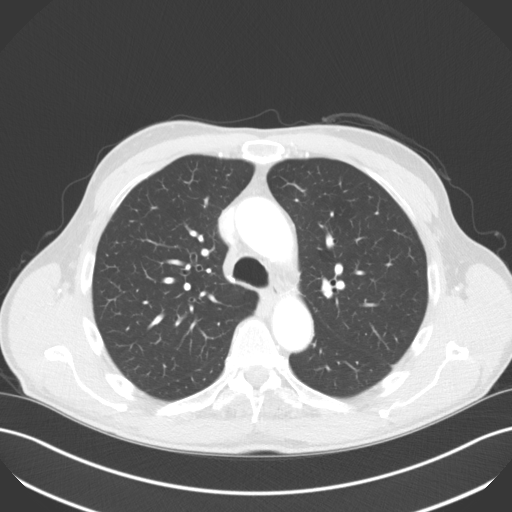
[im 77/110  soft-tissue]
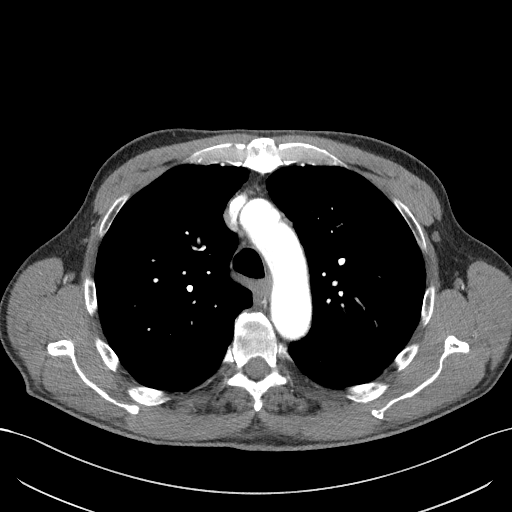
[im 85/110  lung]
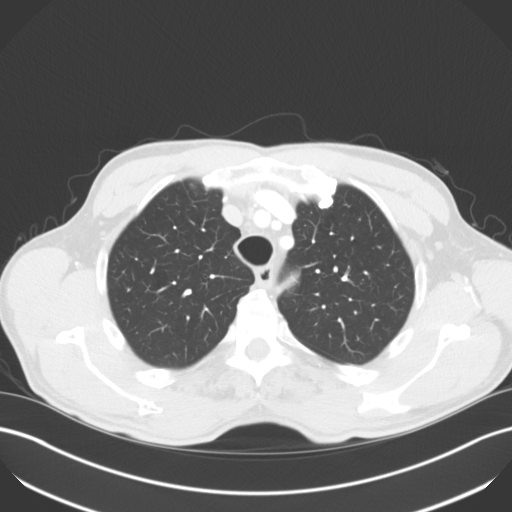
[im 89/110  soft-tissue]
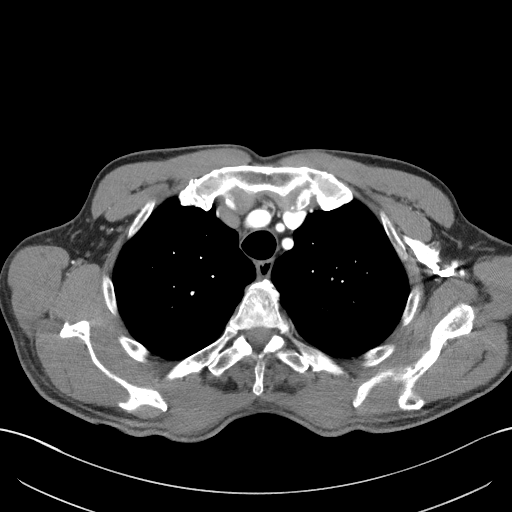
[im 97/110  lung]
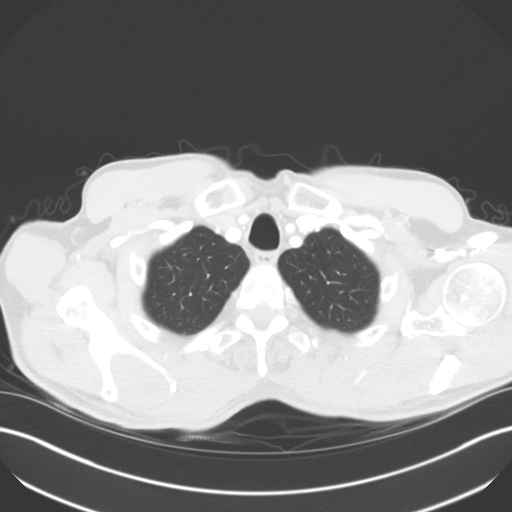
[im 105/110  soft-tissue]
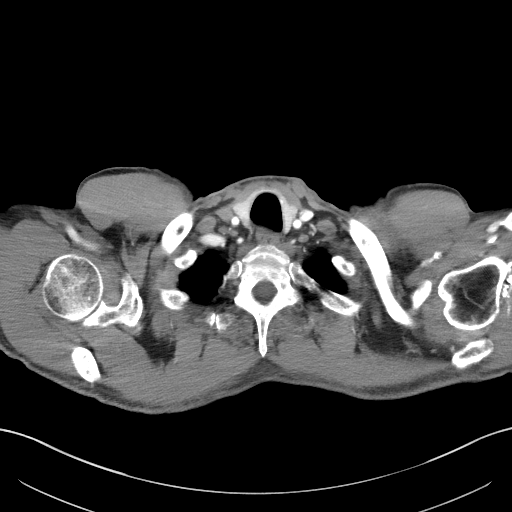

[Series 7: coronals · coronal · 0.64mm/px · 3 of 133 slices shown]
[im 34/133  soft-tissue]
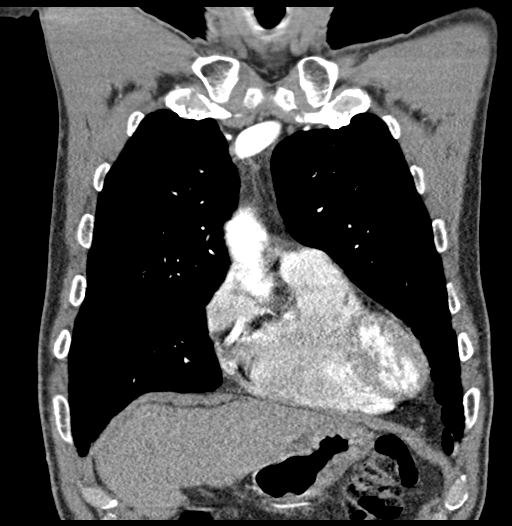
[im 67/133  soft-tissue]
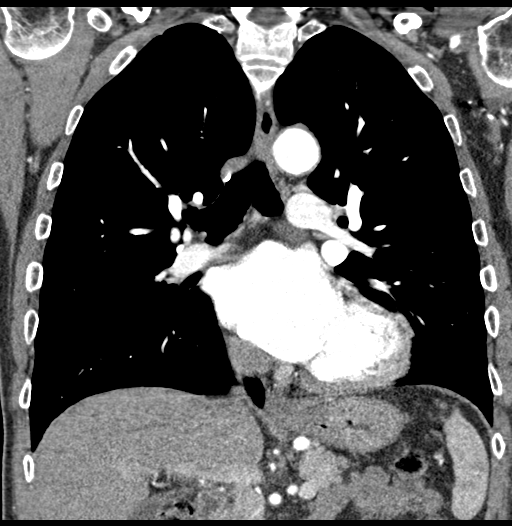
[im 100/133  soft-tissue]
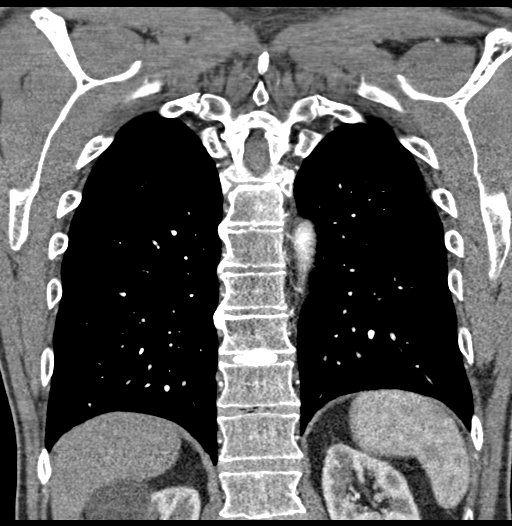

[19 of 46 positions shown; findings below may reference images not displayed]

FINDINGS: Cardiovascular: Again noted is aneurysmal dilatation of the aortic
root at the sinuses of Valsalva, 4.7 cm maximally. Proximal
ascending thoracic aorta 4.0 cm, stable. No dissection. Heart is
mildly enlarged. Coronary artery calcifications in the left anterior
descending and right coronary arteries.

Mediastinum/Nodes: No mediastinal, hilar, or axillary adenopathy.

Lungs/Pleura: Lungs are clear. No focal airspace opacities or
suspicious nodules. No effusions.

Upper Abdomen: Imaging into the upper abdomen shows no acute
findings.

Musculoskeletal: Chest wall soft tissues are unremarkable. No acute
bony abnormality.

Review of the MIP images confirms the above findings.
IMPRESSION: Stable ascending thoracic aortic aneurysm, 4.7 cm at the aortic root
at the sinuses of Valsalva and 4.0 cm in the proximal ascending
thoracic aorta, stable. Recommend semi-annual imaging followup by
CTA or MRA and referral to cardiothoracic surgery if not already
obtained. This recommendation follows 4101
ACCF/AHA/AATS/ACR/ASA/SCA/MEIFUN/CHEVAL BAR HAMRUN/YAG/GILSON Guidelines for the
Diagnosis and Management of Patients With Thoracic Aortic Disease.
Circulation. 4101; 121: [id number]. Aortic aneurysm NOS (KYLAE-MG5.E)

No acute cardiopulmonary disease.

Coronary artery disease.

## 2020-06-08 ENCOUNTER — Ambulatory Visit: Payer: Medicare HMO | Admitting: Internal Medicine

## 2021-03-16 IMAGING — CR DG CHEST 2V
2 series · 2 of 2 positions shown · non-contrast
Comparison: Chest radiograph dated 09/18/2016 and CT dated
10/01/2018

CLINICAL DATA: 73-year-old male with chest pain.

EXAM:
CHEST - 2 VIEW

[chest pa]
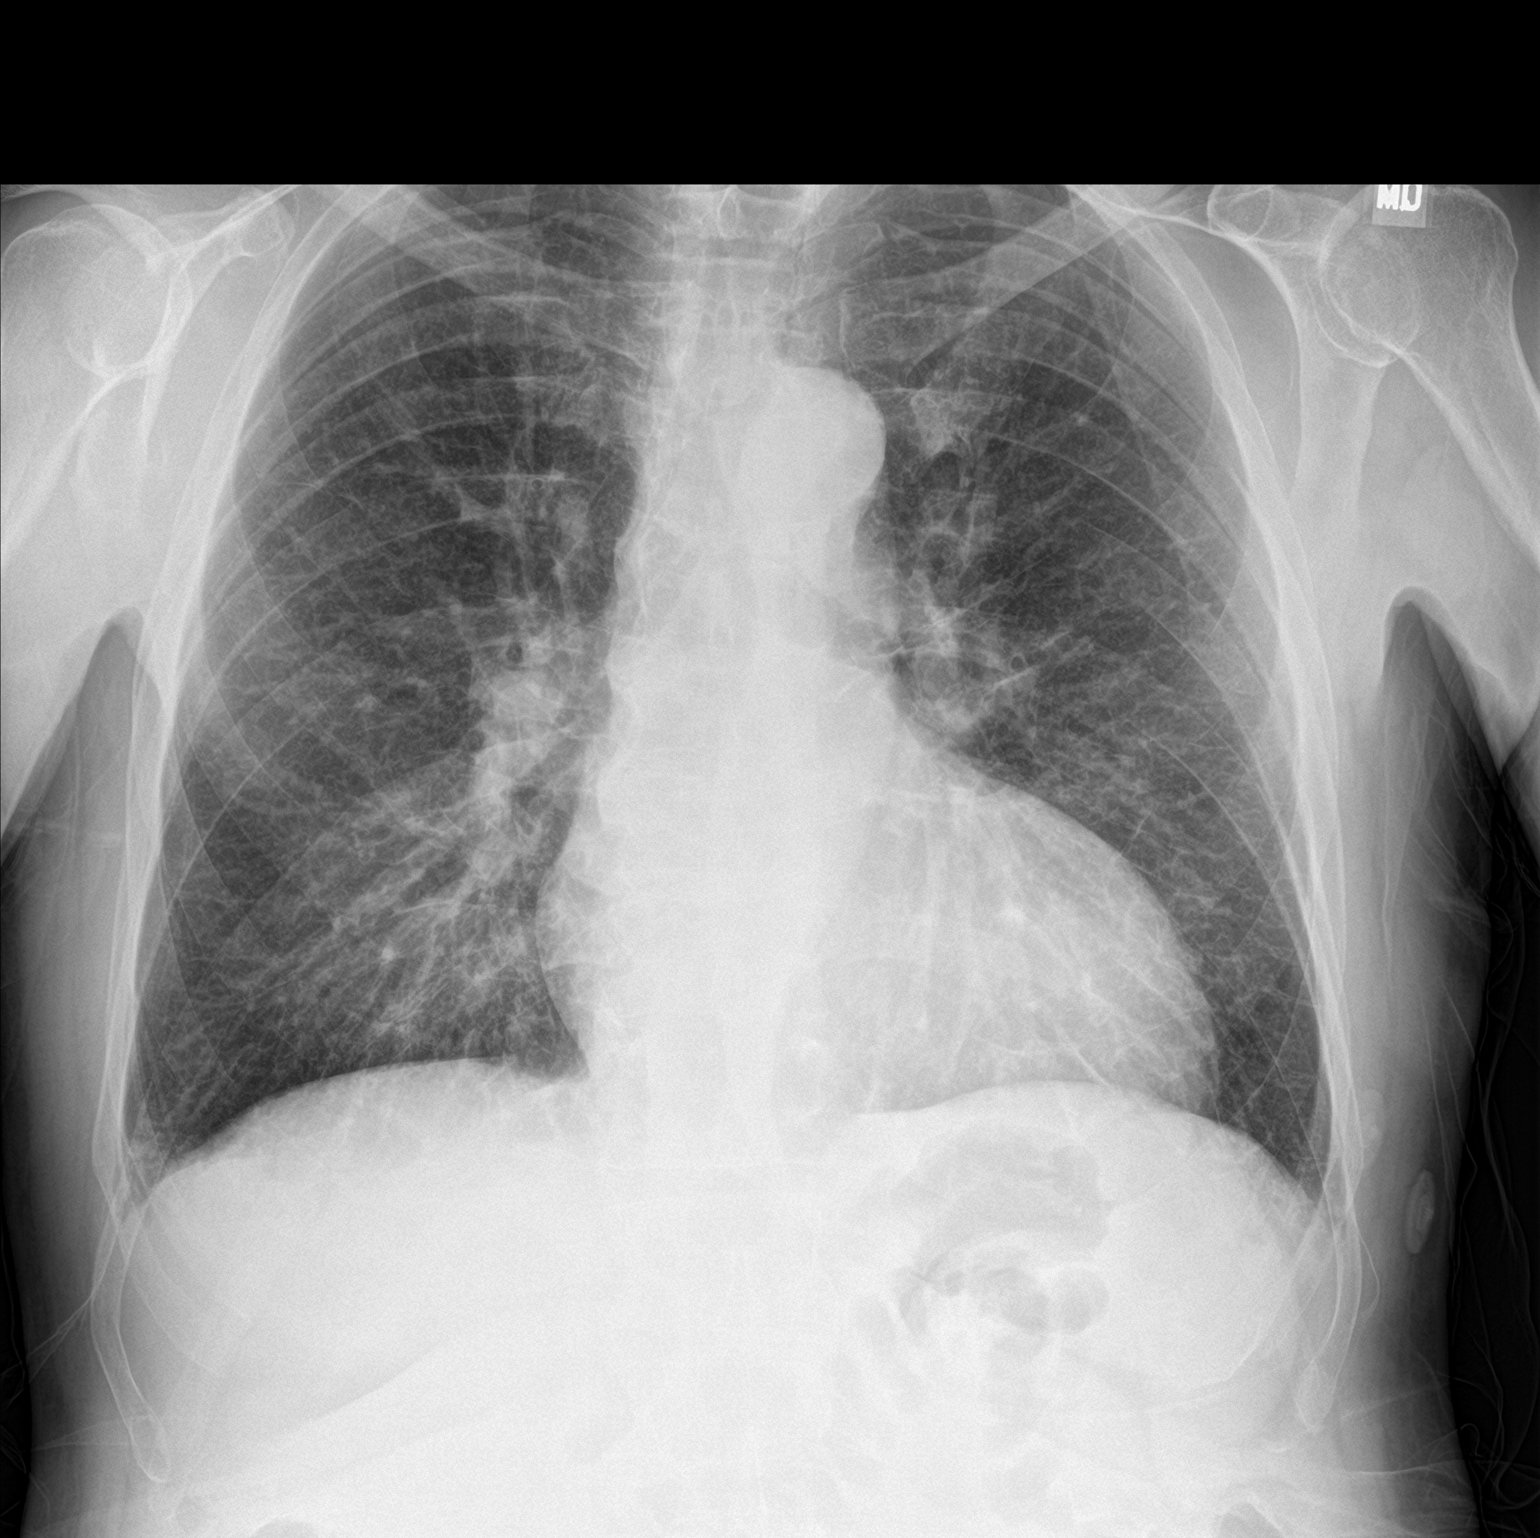

[chest lat]
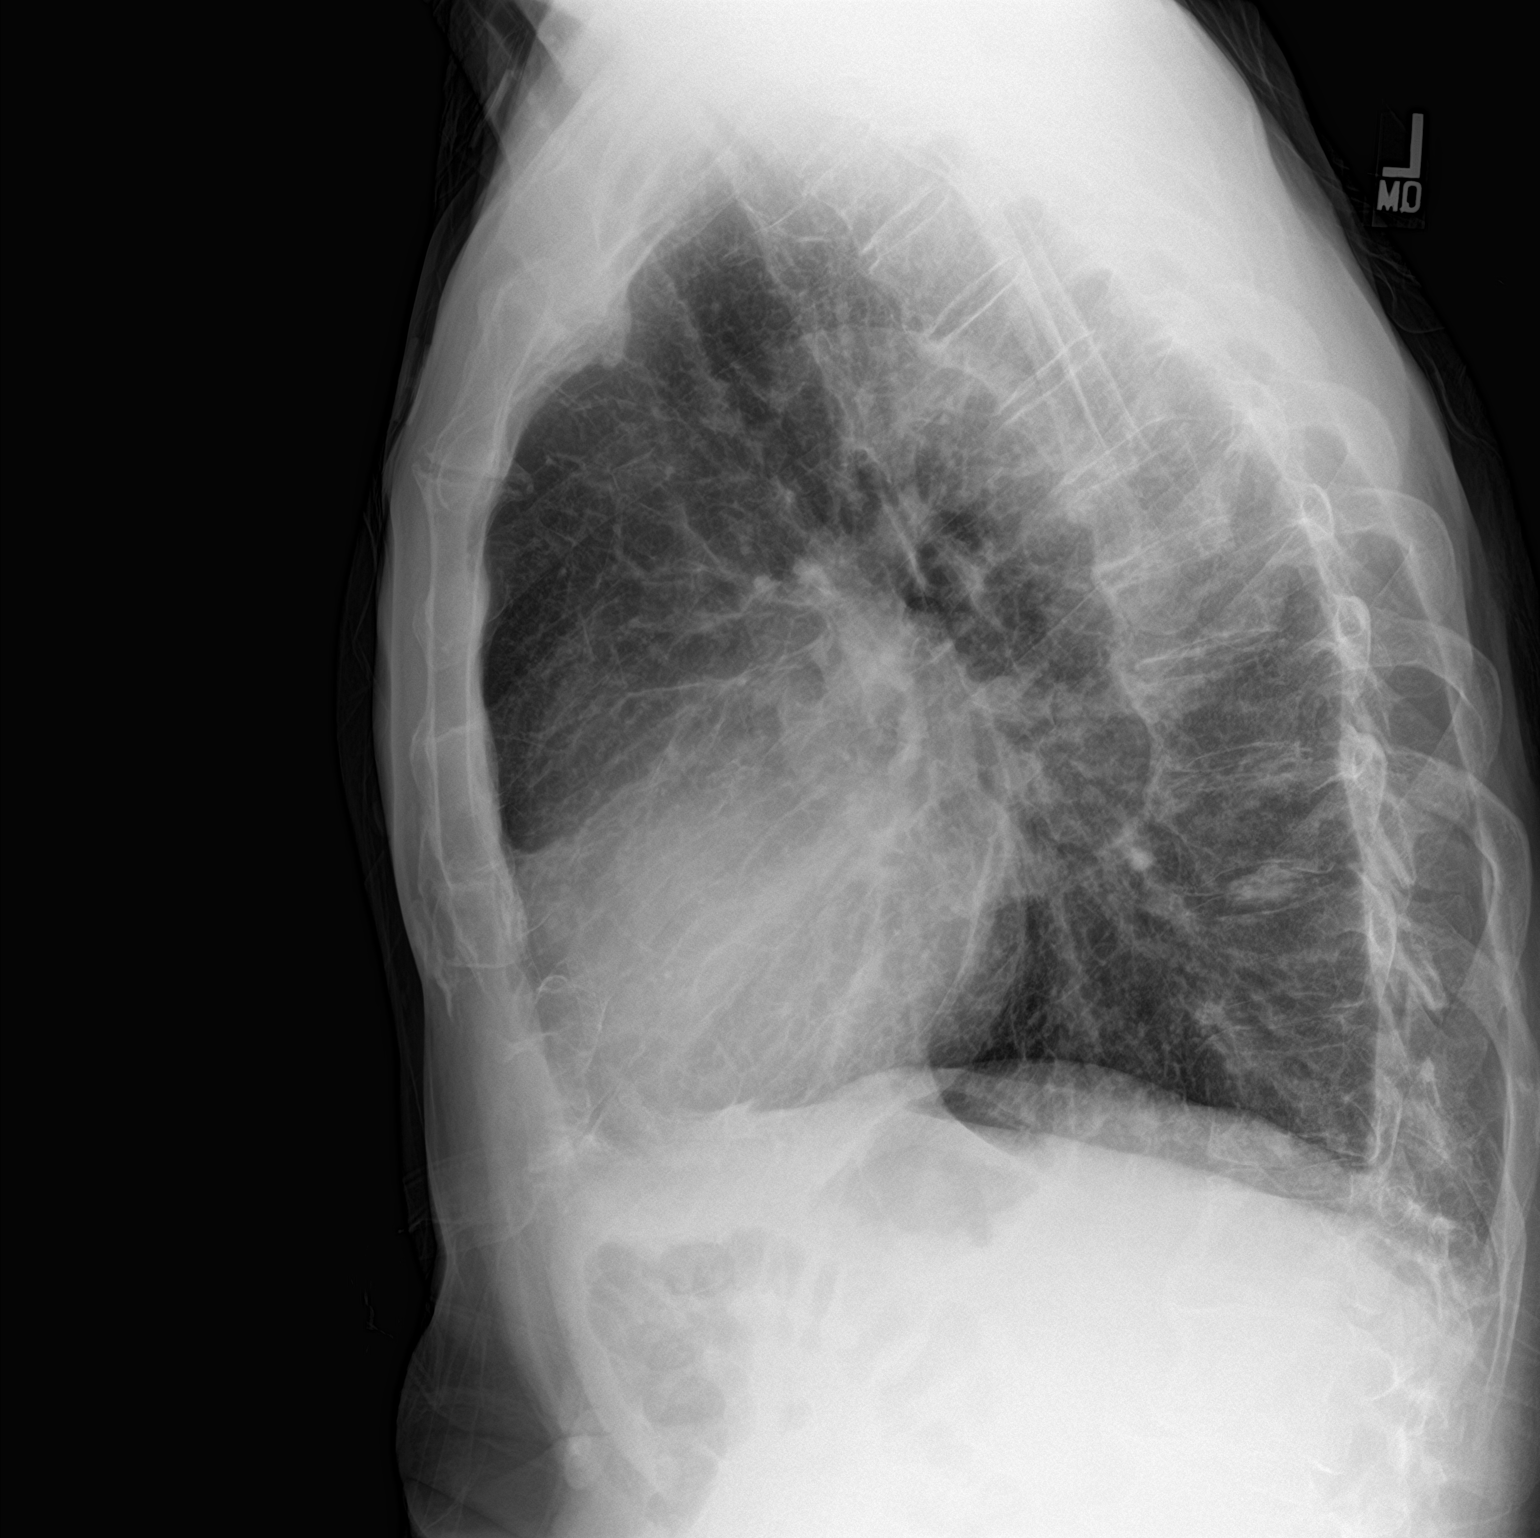

[2 of 2 positions shown; findings below may reference images not displayed]

FINDINGS: Mild diffuse interstitial prominence, may be chronic or represent
mild edema. Atypical pneumonia is not excluded. Clinical correlation
is recommended. No focal consolidation, pleural effusion,
pneumothorax. Stable cardiomegaly. No acute osseous pathology.
Degenerative changes of the spine.
IMPRESSION: Stable cardiomegaly with probable mild interstitial edema.
